# Patient Record
Sex: Female | Born: 1935 | Race: White | Hispanic: No | Marital: Single
Health system: Southern US, Community
[De-identification: ages and names within clinical notes are randomized; demographics above are authoritative.]

## PROBLEM LIST (undated history)

## (undated) DIAGNOSIS — F32A Depression, unspecified: Secondary | ICD-10-CM

## (undated) DIAGNOSIS — M545 Low back pain, unspecified: Secondary | ICD-10-CM

## (undated) DIAGNOSIS — F419 Anxiety disorder, unspecified: Secondary | ICD-10-CM

## (undated) DIAGNOSIS — F0390 Unspecified dementia without behavioral disturbance: Secondary | ICD-10-CM

## (undated) DIAGNOSIS — R55 Syncope and collapse: Secondary | ICD-10-CM

## (undated) DIAGNOSIS — F329 Major depressive disorder, single episode, unspecified: Secondary | ICD-10-CM

## (undated) DIAGNOSIS — G4733 Obstructive sleep apnea (adult) (pediatric): Secondary | ICD-10-CM

## (undated) DIAGNOSIS — M810 Age-related osteoporosis without current pathological fracture: Secondary | ICD-10-CM

## (undated) DIAGNOSIS — G8929 Other chronic pain: Secondary | ICD-10-CM

## (undated) DIAGNOSIS — K219 Gastro-esophageal reflux disease without esophagitis: Secondary | ICD-10-CM

## (undated) HISTORY — PX: ABDOMINAL HYSTERECTOMY: SHX81

## (undated) HISTORY — PX: OTHER SURGICAL HISTORY: SHX169

---

## 1999-03-02 ENCOUNTER — Ambulatory Visit (HOSPITAL_COMMUNITY): Admission: RE | Admit: 1999-03-02 | Discharge: 1999-03-02 | Payer: Self-pay | Admitting: *Deleted

## 1999-10-31 ENCOUNTER — Encounter: Admission: RE | Admit: 1999-10-31 | Discharge: 1999-10-31 | Payer: Self-pay | Admitting: Internal Medicine

## 1999-11-08 ENCOUNTER — Encounter: Payer: Self-pay | Admitting: Internal Medicine

## 1999-11-08 ENCOUNTER — Encounter: Admission: RE | Admit: 1999-11-08 | Discharge: 1999-11-08 | Payer: Self-pay | Admitting: Internal Medicine

## 2000-02-26 ENCOUNTER — Encounter: Payer: Self-pay | Admitting: Neurosurgery

## 2000-02-26 ENCOUNTER — Ambulatory Visit (HOSPITAL_COMMUNITY): Admission: RE | Admit: 2000-02-26 | Discharge: 2000-02-26 | Payer: Self-pay | Admitting: Neurosurgery

## 2000-03-12 ENCOUNTER — Ambulatory Visit (HOSPITAL_COMMUNITY): Admission: RE | Admit: 2000-03-12 | Discharge: 2000-03-12 | Payer: Self-pay | Admitting: Neurosurgery

## 2000-03-12 ENCOUNTER — Encounter: Payer: Self-pay | Admitting: Neurosurgery

## 2000-10-21 ENCOUNTER — Encounter: Admission: RE | Admit: 2000-10-21 | Discharge: 2000-10-21 | Payer: Self-pay | Admitting: Internal Medicine

## 2000-10-21 ENCOUNTER — Encounter: Payer: Self-pay | Admitting: Internal Medicine

## 2001-02-04 ENCOUNTER — Encounter: Payer: Self-pay | Admitting: Internal Medicine

## 2001-02-04 ENCOUNTER — Encounter: Admission: RE | Admit: 2001-02-04 | Discharge: 2001-02-04 | Payer: Self-pay | Admitting: Internal Medicine

## 2001-03-05 ENCOUNTER — Encounter: Payer: Self-pay | Admitting: Internal Medicine

## 2001-03-05 ENCOUNTER — Ambulatory Visit (HOSPITAL_COMMUNITY): Admission: RE | Admit: 2001-03-05 | Discharge: 2001-03-05 | Payer: Self-pay | Admitting: Internal Medicine

## 2001-04-15 ENCOUNTER — Ambulatory Visit: Admission: RE | Admit: 2001-04-15 | Discharge: 2001-04-15 | Payer: Self-pay | Admitting: Pulmonary Disease

## 2002-11-17 ENCOUNTER — Ambulatory Visit (HOSPITAL_COMMUNITY): Admission: RE | Admit: 2002-11-17 | Discharge: 2002-11-17 | Payer: Self-pay | Admitting: *Deleted

## 2002-11-17 ENCOUNTER — Encounter: Payer: Self-pay | Admitting: *Deleted

## 2002-12-04 ENCOUNTER — Ambulatory Visit (HOSPITAL_COMMUNITY): Admission: RE | Admit: 2002-12-04 | Discharge: 2002-12-04 | Payer: Self-pay | Admitting: *Deleted

## 2003-01-06 ENCOUNTER — Emergency Department (HOSPITAL_COMMUNITY): Admission: EM | Admit: 2003-01-06 | Discharge: 2003-01-06 | Payer: Self-pay | Admitting: Emergency Medicine

## 2003-01-06 ENCOUNTER — Encounter: Payer: Self-pay | Admitting: Emergency Medicine

## 2003-11-18 ENCOUNTER — Encounter: Admission: RE | Admit: 2003-11-18 | Discharge: 2003-11-18 | Payer: Self-pay | Admitting: Internal Medicine

## 2004-09-12 ENCOUNTER — Ambulatory Visit (HOSPITAL_COMMUNITY): Admission: RE | Admit: 2004-09-12 | Discharge: 2004-09-12 | Payer: Self-pay | Admitting: Internal Medicine

## 2004-11-07 ENCOUNTER — Ambulatory Visit: Admission: RE | Admit: 2004-11-07 | Discharge: 2004-11-07 | Payer: Self-pay | Admitting: Internal Medicine

## 2004-12-13 ENCOUNTER — Ambulatory Visit: Payer: Self-pay | Admitting: Pulmonary Disease

## 2004-12-22 ENCOUNTER — Encounter: Admission: RE | Admit: 2004-12-22 | Discharge: 2004-12-22 | Payer: Self-pay | Admitting: Internal Medicine

## 2005-01-02 ENCOUNTER — Ambulatory Visit: Payer: Self-pay | Admitting: Pulmonary Disease

## 2005-03-21 ENCOUNTER — Ambulatory Visit: Payer: Self-pay | Admitting: Pulmonary Disease

## 2008-02-19 ENCOUNTER — Encounter: Admission: RE | Admit: 2008-02-19 | Discharge: 2008-02-19 | Payer: Self-pay | Admitting: Internal Medicine

## 2008-06-18 ENCOUNTER — Encounter: Admission: RE | Admit: 2008-06-18 | Discharge: 2008-06-18 | Payer: Self-pay | Admitting: Internal Medicine

## 2008-07-13 ENCOUNTER — Encounter (HOSPITAL_COMMUNITY): Admission: RE | Admit: 2008-07-13 | Discharge: 2008-09-01 | Payer: Self-pay | Admitting: Internal Medicine

## 2008-12-13 ENCOUNTER — Encounter: Admission: RE | Admit: 2008-12-13 | Discharge: 2008-12-13 | Payer: Self-pay | Admitting: Neurosurgery

## 2008-12-15 ENCOUNTER — Encounter: Admission: RE | Admit: 2008-12-15 | Discharge: 2008-12-15 | Payer: Self-pay | Admitting: Neurosurgery

## 2010-10-05 ENCOUNTER — Encounter: Admission: RE | Admit: 2010-10-05 | Discharge: 2010-10-05 | Payer: Self-pay | Admitting: Internal Medicine

## 2011-01-21 ENCOUNTER — Encounter: Payer: Self-pay | Admitting: Neurosurgery

## 2011-05-18 HISTORY — PX: UPPER GI ENDOSCOPY: SHX6162

## 2011-05-18 NOTE — Op Note (Signed)
   NAME:  Morgan Macdonald, Morgan Macdonald                         ACCOUNT NO.:  [id number]   MEDICAL RECORD NO.:  [id number]                   PATIENT TYPE:  AMB   LOCATION:  ENDO                                 FACILITY:  [facility]   PHYSICIAN:  Georgiana Spinner, M.D.                 DATE OF BIRTH:  [birth year]   DATE OF PROCEDURE:  DATE OF DISCHARGE:                                 OPERATIVE REPORT   PROCEDURE:  Upper endoscopy.   INDICATIONS FOR PROCEDURE:  Gastroesophageal reflux disease, abdominal pain.   ANESTHESIA:  Demerol 40, Versed 4 mg.   DESCRIPTION OF PROCEDURE:  With the patient mildly sedated in the left  lateral decubitus position, the Olympus videoscopic endoscope was inserted  in the mouth and passed under direct vision through the esophagus which  showed a possible distal esophageal stricture. This was photographed, it was  mildly inflamed. We entered into the stomach. The fundus, body, antrum,  duodenal bulb and second portion of the duodenum were visualized. From this  point, the endoscope was slowly withdrawn taking circumferential views of  the entire duodenal mucosa and the ampulla was seen and photographed.  The  endoscope was pulled back into the stomach, placed in retroflexion to view  the stomach from below. The endoscope was straightened and withdrawn taking  circumferential views of the remaining gastric and esophageal mucosa. The  patient's vital signs and pulse oximeter remained stable. The patient  tolerated the procedure well without apparent complications.   FINDINGS:  Small amount of blood flecks in the stomach with some  inflammatory changes of the distal esophagus possibly early stricture  formation.   PLAN:  Will start the patient on proton pump inhibitor therapy if not  already on and have patient followup with me as an outpatient.                                                Georgiana Spinner, M.D.    GMO/MEDQ  D:  12/04/2002  T:  12/04/2002  Job:  [id number]

## 2012-05-16 ENCOUNTER — Other Ambulatory Visit: Payer: Self-pay | Admitting: Neurology

## 2012-05-16 DIAGNOSIS — F0280 Dementia in other diseases classified elsewhere without behavioral disturbance: Secondary | ICD-10-CM

## 2012-05-20 ENCOUNTER — Ambulatory Visit
Admission: RE | Admit: 2012-05-20 | Discharge: 2012-05-20 | Disposition: A | Discharge status: Home / Self Care | Payer: Medicare Other | Source: Ambulatory Visit | Attending: Neurology | Admitting: Neurology

## 2012-05-20 DIAGNOSIS — G309 Alzheimer's disease, unspecified: Secondary | ICD-10-CM

## 2014-06-21 ENCOUNTER — Observation Stay (HOSPITAL_COMMUNITY)
Admission: EM | Admit: 2014-06-21 | Discharge: 2014-06-24 | Disposition: A | Discharge status: Nursing Home (Includes ICF) | Payer: Medicare PPO | Attending: Internal Medicine | Admitting: Internal Medicine

## 2014-06-21 ENCOUNTER — Encounter (HOSPITAL_COMMUNITY): Payer: Self-pay | Admitting: Emergency Medicine

## 2014-06-21 ENCOUNTER — Observation Stay (HOSPITAL_COMMUNITY): Payer: Medicare PPO

## 2014-06-21 DIAGNOSIS — Z7982 Long term (current) use of aspirin: Secondary | ICD-10-CM | POA: Insufficient documentation

## 2014-06-21 DIAGNOSIS — Z66 Do not resuscitate: Secondary | ICD-10-CM | POA: Insufficient documentation

## 2014-06-21 DIAGNOSIS — K5900 Constipation, unspecified: Secondary | ICD-10-CM | POA: Insufficient documentation

## 2014-06-21 DIAGNOSIS — Z79899 Other long term (current) drug therapy: Secondary | ICD-10-CM | POA: Insufficient documentation

## 2014-06-21 DIAGNOSIS — I059 Rheumatic mitral valve disease, unspecified: Secondary | ICD-10-CM

## 2014-06-21 DIAGNOSIS — G2581 Restless legs syndrome: Secondary | ICD-10-CM | POA: Insufficient documentation

## 2014-06-21 DIAGNOSIS — F3289 Other specified depressive episodes: Secondary | ICD-10-CM | POA: Insufficient documentation

## 2014-06-21 DIAGNOSIS — F411 Generalized anxiety disorder: Secondary | ICD-10-CM | POA: Insufficient documentation

## 2014-06-21 DIAGNOSIS — K219 Gastro-esophageal reflux disease without esophagitis: Secondary | ICD-10-CM | POA: Insufficient documentation

## 2014-06-21 DIAGNOSIS — F0390 Unspecified dementia without behavioral disturbance: Secondary | ICD-10-CM | POA: Insufficient documentation

## 2014-06-21 DIAGNOSIS — M810 Age-related osteoporosis without current pathological fracture: Secondary | ICD-10-CM | POA: Insufficient documentation

## 2014-06-21 DIAGNOSIS — T671XXD Heat syncope, subsequent encounter: Secondary | ICD-10-CM

## 2014-06-21 DIAGNOSIS — R55 Syncope and collapse: Principal | ICD-10-CM | POA: Insufficient documentation

## 2014-06-21 DIAGNOSIS — F329 Major depressive disorder, single episode, unspecified: Secondary | ICD-10-CM | POA: Insufficient documentation

## 2014-06-21 DIAGNOSIS — E43 Unspecified severe protein-calorie malnutrition: Secondary | ICD-10-CM | POA: Insufficient documentation

## 2014-06-21 HISTORY — DX: Other chronic pain: G89.29

## 2014-06-21 HISTORY — DX: Anxiety disorder, unspecified: F41.9

## 2014-06-21 HISTORY — DX: Unspecified dementia, unspecified severity, without behavioral disturbance, psychotic disturbance, mood disturbance, and anxiety: F03.90

## 2014-06-21 HISTORY — DX: Gastro-esophageal reflux disease without esophagitis: K21.9

## 2014-06-21 HISTORY — DX: Obstructive sleep apnea (adult) (pediatric): G47.33

## 2014-06-21 HISTORY — DX: Age-related osteoporosis without current pathological fracture: M81.0

## 2014-06-21 HISTORY — DX: Low back pain, unspecified: M54.50

## 2014-06-21 HISTORY — DX: Major depressive disorder, single episode, unspecified: F32.9

## 2014-06-21 HISTORY — DX: Depression, unspecified: F32.A

## 2014-06-21 HISTORY — DX: Low back pain: M54.5

## 2014-06-21 LAB — I-STAT TROPONIN, ED: TROPONIN I, POC: 0 ng/mL (ref 0.00–0.08)

## 2014-06-21 LAB — COMPREHENSIVE METABOLIC PANEL
ALT: 13 U/L (ref 0–35)
AST: 29 U/L (ref 0–37)
Albumin: 3.2 g/dL — ABNORMAL LOW (ref 3.5–5.2)
Alkaline Phosphatase: 57 U/L (ref 39–117)
BILIRUBIN TOTAL: 0.3 mg/dL (ref 0.3–1.2)
BUN: 17 mg/dL (ref 6–23)
CALCIUM: 9 mg/dL (ref 8.4–10.5)
CO2: 25 meq/L (ref 19–32)
CREATININE: 0.72 mg/dL (ref 0.50–1.10)
Chloride: 106 mEq/L (ref 96–112)
GFR, EST AFRICAN AMERICAN: 88 mL/min — AB (ref >90)
GFR, EST NON AFRICAN AMERICAN: 76 mL/min — AB (ref >90)
GLUCOSE: 81 mg/dL (ref 70–99)
Potassium: 4.3 mEq/L (ref 3.7–5.3)
Sodium: 144 mEq/L (ref 137–147)
Total Protein: 6.3 g/dL (ref 6.0–8.3)

## 2014-06-21 LAB — CBC WITH DIFFERENTIAL/PLATELET
BASOS ABS: 0 10*3/uL (ref 0.0–0.1)
Basophils Relative: 0 % (ref 0–1)
EOS PCT: 0 % (ref 0–5)
Eosinophils Absolute: 0 10*3/uL (ref 0.0–0.7)
HEMATOCRIT: 44.3 % (ref 36.0–46.0)
HEMOGLOBIN: 14.4 g/dL (ref 12.0–15.0)
LYMPHS PCT: 17 % (ref 12–46)
Lymphs Abs: 1.2 10*3/uL (ref 0.7–4.0)
MCH: 31.9 pg (ref 26.0–34.0)
MCHC: 32.5 g/dL (ref 30.0–36.0)
MCV: 98.2 fL (ref 78.0–100.0)
MONO ABS: 0.5 10*3/uL (ref 0.1–1.0)
MONOS PCT: 7 % (ref 3–12)
Neutro Abs: 5.3 10*3/uL (ref 1.7–7.7)
Neutrophils Relative %: 76 % (ref 43–77)
Platelets: 185 10*3/uL (ref 150–400)
RBC: 4.51 MIL/uL (ref 3.87–5.11)
RDW: 13.1 % (ref 11.5–15.5)
WBC: 7.1 10*3/uL (ref 4.0–10.5)

## 2014-06-21 LAB — URINALYSIS, ROUTINE W REFLEX MICROSCOPIC
Bilirubin Urine: NEGATIVE
Glucose, UA: NEGATIVE mg/dL
Hgb urine dipstick: NEGATIVE
Ketones, ur: NEGATIVE mg/dL
Leukocytes, UA: NEGATIVE
NITRITE: NEGATIVE
PROTEIN: NEGATIVE mg/dL
SPECIFIC GRAVITY, URINE: 1.021 (ref 1.005–1.030)
UROBILINOGEN UA: 1 mg/dL (ref 0.0–1.0)
pH: 7.5 (ref 5.0–8.0)

## 2014-06-21 LAB — TROPONIN I
Troponin I: <0.3 ng/mL (ref <0.30)
Troponin I: <0.3 ng/mL (ref <0.30)

## 2014-06-21 LAB — CBG MONITORING, ED: Glucose-Capillary: 74 mg/dL (ref 70–99)

## 2014-06-21 LAB — MRSA PCR SCREENING: MRSA by PCR: POSITIVE — AB

## 2014-06-21 LAB — TSH: TSH: 3.24 u[IU]/mL (ref 0.350–4.500)

## 2014-06-21 MED ORDER — MEMANTINE HCL ER 28 MG PO CP24
28.0000 mg | ORAL_CAPSULE | Freq: Every day | ORAL | Status: DC
  Administered 2014-06-21 – 2014-06-24 (×4): 28 mg via ORAL
  Filled 2014-06-21 (×4): qty 28

## 2014-06-21 MED ORDER — ONDANSETRON HCL 4 MG PO TABS: 4.0000 mg | ORAL_TABLET | Freq: Four times a day (QID) | ORAL | Status: DC | PRN

## 2014-06-21 MED ORDER — ASPIRIN 81 MG PO CHEW
81.0000 mg | CHEWABLE_TABLET | Freq: Every day | ORAL | Status: DC
  Administered 2014-06-21 – 2014-06-24 (×4): 81 mg via ORAL
  Filled 2014-06-21 (×4): qty 1

## 2014-06-21 MED ORDER — CHLORHEXIDINE GLUCONATE CLOTH 2 % EX PADS
6.0000 | MEDICATED_PAD | Freq: Every day | CUTANEOUS | Status: DC
  Administered 2014-06-22 – 2014-06-24 (×3): 6 via TOPICAL

## 2014-06-21 MED ORDER — ACETAMINOPHEN 650 MG RE SUPP: 650.0000 mg | Freq: Four times a day (QID) | RECTAL | Status: DC | PRN

## 2014-06-21 MED ORDER — ONDANSETRON HCL 4 MG/2ML IJ SOLN: 4.0000 mg | Freq: Four times a day (QID) | INTRAMUSCULAR | Status: DC | PRN

## 2014-06-21 MED ORDER — DIVALPROEX SODIUM ER 500 MG PO TB24
750.0000 mg | ORAL_TABLET | Freq: Every day | ORAL | Status: DC
  Administered 2014-06-21 – 2014-06-24 (×4): 750 mg via ORAL
  Filled 2014-06-21 (×6): qty 1

## 2014-06-21 MED ORDER — ACETAMINOPHEN 325 MG PO TABS: 650.0000 mg | ORAL_TABLET | Freq: Four times a day (QID) | ORAL | Status: DC | PRN

## 2014-06-21 MED ORDER — MUPIROCIN 2 % EX OINT
1.0000 "application " | TOPICAL_OINTMENT | Freq: Two times a day (BID) | CUTANEOUS | Status: DC
  Administered 2014-06-22 – 2014-06-24 (×5): 1 via NASAL
  Filled 2014-06-21: qty 22

## 2014-06-21 MED ORDER — BIOTENE DRY MOUTH MT LIQD
15.0000 mL | Freq: Two times a day (BID) | OROMUCOSAL | Status: DC
  Administered 2014-06-21 – 2014-06-24 (×6): 15 mL via OROMUCOSAL

## 2014-06-21 MED ORDER — LORAZEPAM 0.5 MG PO TABS
0.2500 mg | ORAL_TABLET | Freq: Every day | ORAL | Status: DC | PRN
  Administered 2014-06-23: 0.25 mg via ORAL
  Filled 2014-06-21 (×2): qty 1

## 2014-06-21 MED ORDER — SERTRALINE HCL 50 MG PO TABS
50.0000 mg | ORAL_TABLET | Freq: Every day | ORAL | Status: DC
  Administered 2014-06-21 – 2014-06-24 (×4): 50 mg via ORAL
  Filled 2014-06-21 (×4): qty 1

## 2014-06-21 MED ORDER — DONEPEZIL HCL 10 MG PO TABS
10.0000 mg | ORAL_TABLET | Freq: Every day | ORAL | Status: DC
  Administered 2014-06-21 – 2014-06-23 (×3): 10 mg via ORAL
  Filled 2014-06-21 (×4): qty 1

## 2014-06-21 MED ORDER — ENOXAPARIN SODIUM 40 MG/0.4ML ~~LOC~~ SOLN
40.0000 mg | SUBCUTANEOUS | Status: DC
  Administered 2014-06-21 – 2014-06-23 (×3): 40 mg via SUBCUTANEOUS
  Filled 2014-06-21 (×4): qty 0.4

## 2014-06-21 MED ORDER — SODIUM CHLORIDE 0.9 % IV SOLN
INTRAVENOUS | Status: DC
  Administered 2014-06-21: 75 mL/h via INTRAVENOUS
  Administered 2014-06-22 – 2014-06-23 (×3): via INTRAVENOUS

## 2014-06-21 NOTE — H&P (Signed)
PCP:   No primary provider on file.   Chief Complaint:  Passed out  HPI: 80 year old female with a history of dementia, osteoporosis, depression/anxiety, GERD, obstructive sleep apnea, chronic low back pain, restless leg syndrome who was brought to the ED [facility], after patient had an episode of syncope. Patient has dementia and is a poor historian as per the ED notes patient got up from sitting position and after that she felt dizzy and was about to fall. The nursing home staff assisted her to the floor and she did not fall. She denies hitting her head, denies pain anywhere in the body .As per the  Report patient did pass out for about 10 seconds. There was no witnessed seizure like activity. In the ED patient is asymptomatic, denies any complaints denies chest pain no shortness of breath no nausea vomiting or diarrhea. She is alert but not oriented x3. Cardiac enzymes are negative in the ED.  Allergies:  No Known Allergies  Past medical history Dementia GERD Depression Obstructive sleep apnea Chronic low back pain Restless leg syndrome    Prior to Admission medications   Medication Sig Start Date End Date Taking? Authorizing Provider  aspirin 81 MG chewable tablet Chew 81 mg by mouth every morning.   Yes Historical Provider, MD  divalproex (DEPAKOTE ER) 250 MG 24 hr tablet Take 750 mg by mouth every morning.   Yes Historical Provider, MD  donepezil (ARICEPT) 10 MG tablet Take 10 mg by mouth at bedtime.   Yes Historical Provider, MD  LORazepam (ATIVAN) 0.5 MG tablet Take 0.25 mg by mouth daily as needed for anxiety.   Yes Historical Provider, MD  Memantine HCl ER (NAMENDA XR) 28 MG CP24 Take 28 mg by mouth every morning.   Yes Historical Provider, MD  Multiple Vitamins-Iron (MULTIVITAMINS WITH IRON) TABS tablet Take 1 tablet by mouth every morning.   Yes Historical Provider, MD  sertraline (ZOLOFT) 50 MG tablet Take 50 mg by mouth  every morning.   Yes Historical Provider, MD  vitamin E 400 UNIT capsule Take 400 Units by mouth every morning.   Yes Historical Provider, MD    Social History:  reports that she does not drink alcohol. Her tobacco and drug histories are not on file.    All the positives are listed in BOLD  Review of Systems:  HEENT: Headache, blurred vision, runny nose, sore throat Neck: Hypothyroidism, hyperthyroidism,,lymphadenopathy Chest : Shortness of breath, history of COPD, Asthma Heart : Chest pain, history of coronary arterey disease GI:  Nausea, vomiting, diarrhea, constipation, GERD GU: Dysuria, urgency, frequency of urination, hematuria Psych: Depression, anxiety, hallucinations   Physical Exam: Blood pressure 136/89, pulse 79, temperature 97.4 F (36.3 C), temperature source Oral, resp. rate 15, SpO2 100.00%. Constitutional:   Patient is a well-developed and well-nourished female, pleasantly confused* in no acute distress and cooperative with exam. Head: Normocephalic and atraumatic Mouth: Mucus membranes moist Eyes: PERRL, EOMI, conjunctivae normal Neck: Supple, No Thyromegaly Cardiovascular: RRR, S1 normal, S2 normal Pulmonary/Chest: CTAB, no wheezes, rales, or rhonchi Abdominal: Soft. Non-tender, non-distended, bowel sounds are normal, no masses, organomegaly, or guarding present.  Neurological: Alert but not oriented x3, pleasantly confused, follow instructions,  Strenght is normal and symmetric bilaterally, cranial nerve II-XII are grossly intact, no focal motor deficit, sensory intact to light touch bilaterally.  Extremities : No Cyanosis, Clubbing or Edema  Labs on Admission:  Basic Metabolic Panel:  Recent Labs Lab 06/21/14  1129  NA 144  K 4.3  CL 106  CO2 25  GLUCOSE 81  BUN 17  CREATININE 0.72  CALCIUM 9.0   Liver Function Tests:  Recent Labs Lab 06/21/14 1129  AST 29  ALT 13  ALKPHOS 57  BILITOT 0.3  PROT 6.3  ALBUMIN 3.2*   No results found for  this basename: LIPASE, AMYLASE,  in the last 168 hours No results found for this basename: AMMONIA,  in the last 168 hours CBC:  Recent Labs Lab 06/21/14 1129  WBC 7.1  NEUTROABS 5.3  HGB 14.4  HCT 44.3  MCV 98.2  PLT 185   CBG:  Recent Labs Lab 06/21/14 0926  GLUCAP 74    Radiological Exams on Admission: No results found.  EKG: Independently reviewed. Normal sinus rhythm with borderline right axis deviation   Assessment/Plan Active Problems:   Syncope   Dementia  Syncope Will admit the patient under observation, telemetry. Obtain 2-D echocardiogram and serial cardiac enzymes. Also check orthostatics every shift  Based on the above results patient may need outpatient Holter monitoring. And also check CT head to rule out any underlying neurologic abnormality  Dementia Continue Namenda, Aricept, divalproex  Anxiety Continue Ativan when necessary  DVT prophylaxis Lovenox  Code status: Patient is full code  Family discussion: No family at bedside,    Time Spent on Admission: 55 minutes  LAMA,GAGAN S [facility] Pager: [phone] 06/21/2014, 1:43 PM  If 7PM-7AM, please contact night-coverage  [email or web address]  Password [email or web address]

## 2014-06-21 NOTE — Progress Notes (Signed)
CRITICAL VALUE ALERT  Critical value received:  MRSA Positive   Date of notification:  06/21/14  Time of notification:  2215 Critical value read back:yes  Nurse who received alert:  Linward NatalLindsay Hudson RN  MD notified (1st page):  NA Time of first page:  NA MD notified (2nd page):  Time of second page:  Responding MD:  NA Time MD responded:  NA RN put in orders for MRSA positive

## 2014-06-21 NOTE — Progress Notes (Signed)
  Echocardiogram 2D Echocardiogram has been performed.  Jorje GuildCHUNG, KWONG 06/21/2014, 4:09 PM

## 2014-06-21 NOTE — ED Notes (Signed)
Bed: WA24 Expected date:  Expected time:  Means of arrival:  Comments: EMS Fall 

## 2014-06-21 NOTE — ED Notes (Signed)
Patient lives in a [facility]  Had a syncople episode this am around 0800, that was witnessed by nursing staff. They assisted her to floor - she did not fall. Patient has Alzheimer's disease and in not oriented to time, date, or place.

## 2014-06-21 NOTE — ED Provider Notes (Signed)
CSN: [id number]     Arrival date & time 06/21/14  [id number] History   First MD Initiated Contact with Patient 06/21/14 0945     Chief Complaint  Patient presents with  . Loss of Consciousness    Per EMS, patient told staff nearby that she was feeling tired, then passed out. She was helped to the floor with the present staff, and did not fall.  Nursing home staff state she was unconsious for about 10 seconds, then awoke confused about what had occured.     (Consider location/radiation/quality/duration/timing/severity/associated sxs/prior Treatment) HPI Comments: [facility] due to a syncopal episode.  I spoke with staff on the phone and they report that around 8 AM the patient was complaining of feeling weak while standing and started to pass out.  They then caught her and lowered her to the floor.  She did not fall.  They report that the patient was unconscious for approximately ten seconds.  When she regained consciousness the patient was unaware of what occurred.  Patient does have dementia at baseline.  Staff report that they did not notice any seizure activity.  Staff report that her Depakote dose was recently increased 250 mg tid recently.  No other recent changes to medication.  She is on 81 mg ASA, but no other anticoagulants.  Patient denies chest pain or any other pain at this time.  Denies SOB.  Staff report that the patient was at her baseline prior to his episode.  No vomiting, diarrhea, or fever.    The history is provided by the patient and the EMS personnel (Nursing home staff).    No past medical history on file. No past surgical history on file. No family history on file. History  Substance Use Topics  . Smoking status: Not on file  . Smokeless tobacco: Not on file  . Alcohol Use: No   OB History   Grav Para Term Preterm Abortions TAB SAB Ect Mult Living                 Review of Systems  Unable to perform ROS: Dementia       Allergies  Review of patient's allergies indicates no known allergies.  Home Medications   Prior to Admission medications   Medication Sig Start Date End Date Taking? Authorizing Provider  aspirin 81 MG chewable tablet Chew 81 mg by mouth every morning.   Yes Historical Provider, MD  divalproex (DEPAKOTE ER) 250 MG 24 hr tablet Take 750 mg by mouth every morning.   Yes Historical Provider, MD  donepezil (ARICEPT) 10 MG tablet Take 10 mg by mouth at bedtime.   Yes Historical Provider, MD  LORazepam (ATIVAN) 0.5 MG tablet Take 0.25 mg by mouth daily as needed for anxiety.   Yes Historical Provider, MD  Memantine HCl ER (NAMENDA XR) 28 MG CP24 Take 28 mg by mouth every morning.   Yes Historical Provider, MD  Multiple Vitamins-Iron (MULTIVITAMINS WITH IRON) TABS tablet Take 1 tablet by mouth every morning.   Yes Historical Provider, MD  sertraline (ZOLOFT) 50 MG tablet Take 50 mg by mouth every morning.   Yes Historical Provider, MD  vitamin E 400 UNIT capsule Take 400 Units by mouth every morning.   Yes Historical Provider, MD   BP 132/82  Pulse 60  Temp(Src) 97.4 F (36.3 C) (Oral)  Resp 15  SpO2 100% Physical Exam  Nursing note and vitals reviewed. Constitutional: She appears well-developed  and well-nourished.  HENT:  Head: Normocephalic and atraumatic.  Mouth/Throat: Oropharynx is clear and moist.  Eyes: EOM are normal. Pupils are equal, round, and reactive to light.  Neck: Normal range of motion. Neck supple.  Cardiovascular: Normal rate, regular rhythm and normal heart sounds.   Pulmonary/Chest: Effort normal and breath sounds normal.  Abdominal: Soft. Bowel sounds are normal. She exhibits no distension. There is no tenderness.  Musculoskeletal: Normal range of motion.  Full ROM of UE and LE without pain  Neurological: She is alert. She has normal strength. No cranial nerve deficit. Gait normal.  Skin: Skin is warm and dry.  Psychiatric: She has a normal mood and  affect.    ED Course  Procedures (including critical care time) Labs Review Labs Reviewed  CBC WITH DIFFERENTIAL  COMPREHENSIVE METABOLIC PANEL  URINALYSIS, ROUTINE W REFLEX MICROSCOPIC  CBG MONITORING, ED    Imaging Review No results found.   EKG Interpretation   Date/Time:  Monday June 21 2014 09:17:19 EDT Ventricular Rate:  59 PR Interval:  120 QRS Duration: 82 QT Interval:  436 QTC Calculation: 432 R Axis:   87 Text Interpretation:  Sinus rhythm Borderline right axis deviation No old  tracing to compare Confirmed by [facility]  MD, TREY ([id number]) on 06/21/2014  11:11:28 AM      MDM   Final diagnoses:  None  Patient with a history of Dementia presents today from [facility] due to a syncopal episode that occurred this morning.  Patient was lowered down to the ground by staff and did not fall.  Full ROM of all extremities, able to ambulate, no signs of head trauma, and no focal neurological deficits.  Therefore, do not feel that imaging is indicated at this time.  VSS.  Labs unremarkable.  UA negative.  No ischemic changes on EKG.  Patient admitted to [facility] for further work up of syncope.      Santiago GladHeather Kazaria Gaertner, PA-C 06/22/14 2304

## 2014-06-21 NOTE — Progress Notes (Signed)
  CARE MANAGEMENT ED NOTE 06/21/2014  Patient:  Morgan Macdonald,Morgan Macdonald   Account Number:  [id number]  Date Initiated:  06/21/2014  Documentation initiated by:  Edd ArbourGIBBS,Dawana Asper  Subjective/Objective Assessment:   80 yr old [place] choice ppo [facility] alzheimer's without pcp listed in EPIC     Subjective/Objective Assessment Detail:   [facility] sheet indicates pcp as henry tripp  Pt noted to be alert to person not place, time or situation Unable to tell Cm  where she was Noted attempting to get out of bed at the end Redirected and assisted back in bed Provided with apple juice after speaking with ED RN     Action/Plan:   Spoke with pt Assisted back to bed EPIC updated   Action/Plan Detail:   Anticipated DC Date:  06/22/2014     Status Recommendation to Physician:   Result of Recommendation:    Other ED Services  Consult Working Plan    DC Planning Services  Other  PCP issues  Outpatient Services - Pt will follow up    Choice offered to / List presented to:            Status of service:  Completed, signed off  ED Comments:   ED Comments Detail:

## 2014-06-21 NOTE — ED Notes (Signed)
Patient transported to CT 

## 2014-06-22 LAB — COMPREHENSIVE METABOLIC PANEL
ALT: 10 U/L (ref 0–35)
AST: 20 U/L (ref 0–37)
Albumin: 2.6 g/dL — ABNORMAL LOW (ref 3.5–5.2)
Alkaline Phosphatase: 46 U/L (ref 39–117)
BUN: 11 mg/dL (ref 6–23)
CALCIUM: 8.3 mg/dL — AB (ref 8.4–10.5)
CHLORIDE: 106 meq/L (ref 96–112)
CO2: 26 mEq/L (ref 19–32)
Creatinine, Ser: 0.71 mg/dL (ref 0.50–1.10)
GFR calc non Af Amer: 76 mL/min — ABNORMAL LOW (ref >90)
GFR, EST AFRICAN AMERICAN: 89 mL/min — AB (ref >90)
Glucose, Bld: 78 mg/dL (ref 70–99)
Potassium: 4 mEq/L (ref 3.7–5.3)
SODIUM: 141 meq/L (ref 137–147)
Total Bilirubin: 0.3 mg/dL (ref 0.3–1.2)
Total Protein: 5 g/dL — ABNORMAL LOW (ref 6.0–8.3)

## 2014-06-22 LAB — CBC
HCT: 38.4 % (ref 36.0–46.0)
HEMOGLOBIN: 12.6 g/dL (ref 12.0–15.0)
MCH: 32.1 pg (ref 26.0–34.0)
MCHC: 32.8 g/dL (ref 30.0–36.0)
MCV: 97.7 fL (ref 78.0–100.0)
Platelets: 158 10*3/uL (ref 150–400)
RBC: 3.93 MIL/uL (ref 3.87–5.11)
RDW: 13 % (ref 11.5–15.5)
WBC: 5.1 10*3/uL (ref 4.0–10.5)

## 2014-06-22 LAB — TROPONIN I

## 2014-06-22 NOTE — Progress Notes (Signed)
Clinical Social Work Department BRIEF PSYCHOSOCIAL ASSESSMENT 06/22/2014  Patient:  Morgan Macdonald,Morgan Macdonald     Account Number:  [id number]     Admit date:  06/21/2014  Clinical Social Worker:  Orpah GreekFOLEY,KELLY, LCSWA  Date/Time:  06/22/2014 10:47 AM  Referred by:  Physician  Date Referred:  06/22/2014 Referred for  Other - See comment   Other Referral:   Admitted from: [facility] [address] [facility]   Interview type:  Family Other interview type:   patient's husband, Ree KidaJack via phone    PSYCHOSOCIAL DATA Living Status:  FACILITY [facility] Level of care:  Assisted Living Primary support name:  Morgan MouldsJack Macdonald (husband) ph#: [phone] Primary support relationship to patient:  SPOUSE Degree of support available:   good    CURRENT CONCERNS Current Concerns  Post-Acute Placement   Other Concerns:    SOCIAL WORK ASSESSMENT / PLAN CSW received consult that patient was admitted from [facility] [address] [facility].   Assessment/plan status:  Information/Referral to [facility] Other assessment/ plan:   Information/referral to community resources:   CSW completed FL[facility] and [facility], confirmed with France RavensMercedes that she should be able to return when stable.    PATIENT'S/FAMILY'S RESPONSE TO PLAN OF CARE: Patient's husband states that he spoke with Dr. Elvera LennoxGherghe this morning and is in agreement with returning to ALF, anticipating possible discharge tomorrow.       Lincoln MaxinKelly Harrison, LCSW [facility] Clinical Social Worker cell #: [phone]

## 2014-06-22 NOTE — Progress Notes (Signed)
Utilization review completed.  

## 2014-06-22 NOTE — Progress Notes (Signed)
PROGRESS NOTE  Lauris Chromanorma B Moss RUE:[id number] DOB: 1926/02/25 DOA: 06/21/2014 PCP: Florentina JennyRIPP, HENRY, MD  HPI: 80 year old female with a history of dementia, osteoporosis, depression/anxiety, GERD, obstructive sleep apnea, chronic low back pain, restless leg syndrome who was [facility] the ED [facility], after patient had an episode of syncope. Patient has dementia and is a poor historian as per the ED notes patient got up from sitting position and after that she felt dizzy and was about to fall. The nursing home staff assisted her to the floor and she did not fall. She denies hitting her head, denies pain anywhere in the body .As per the Report patient did pass out for about 10 seconds. There was no witnessed seizure like activity. In the ED patient is asymptomatic, denies any complaints denies chest pain no shortness of breath no nausea vomiting or diarrhea. She is alert but not oriented x3. Cardiac enzymes are negative in the ED.  Assessment/Plan: Syncope - likely vasovagal vs dehydration, 2D echo pending, CE negative x 3 - obtain carotid dopplers - repeat orthostatics today, continue gentle hydration Dementia - Continue Namenda, Aricept, divalproex  Anxiety - Continue Ativan when necessary   Diet: heart Fluids: NS DVT Prophylaxis: Lovenox  Code Status: DNR Family Communication: d/w husband  Disposition Plan: SNF in am pending imaging studies  Consultants:  None   Procedures:  2D echo pending  Carotid doppler pending   Antibiotics - none   HPI/Subjective: No complaints  Objective: Filed Vitals:   06/21/14 2055 06/21/14 2059 06/21/14 2104 06/22/14 0412  BP: 126/65 144/44 136/107 132/63  Pulse: 63 73 76 56  Temp: 98.2 F (36.8 C)   97.4 F (36.3 C)  TempSrc: Oral   Axillary  Resp: 20   16  Height: 5\' 2"  (1.575 m)     Weight:      SpO2: 98% 99% 100% 100%    Intake/Output Summary (Last 24 hours) at 06/22/14 1208 Last data  filed at 06/22/14 [id number]  Gross per 24 hour  Intake 1253.75 ml  Output   1125 ml  Net 128.75 ml   Filed Weights   06/21/14 1632  Weight: 44.498 kg (98 lb 1.6 oz)    Exam:  General:  NAD, dementia  Cardiovascular: regular rate and rhythm, without MRG  Respiratory: good air movement, clear to auscultation throughout, no wheezing, ronchi or rales  Abdomen: soft, not tender to palpation, positive bowel sounds  MSK: no peripheral edema  Neuro: non focal  Data Reviewed: Basic Metabolic Panel:  Recent Labs Lab 06/21/14 1129 06/22/14 0335  NA 144 141  K 4.3 4.0  CL 106 106  CO2 25 26  GLUCOSE 81 78  BUN 17 11  CREATININE 0.72 0.71  CALCIUM 9.0 8.3*   Liver Function Tests:  Recent Labs Lab 06/21/14 1129 06/22/14 0335  AST 29 20  ALT 13 10  ALKPHOS 57 46  BILITOT 0.3 0.3  PROT 6.3 5.0*  ALBUMIN 3.2* 2.6*   CBC:  Recent Labs Lab 06/21/14 1129 06/22/14 0335  WBC 7.1 5.1  NEUTROABS 5.3  --   HGB 14.4 12.6  HCT 44.3 38.4  MCV 98.2 97.7  PLT 185 158   Cardiac Enzymes:  Recent Labs Lab 06/21/14 1534 06/21/14 2048 06/22/14 0335  TROPONINI <0.30 <0.30 <0.30   CBG:  Recent Labs Lab 06/21/14 0926  GLUCAP 74    Recent Results (from the past 240 hour(s))  MRSA PCR SCREENING  Status: Abnormal   Collection Time    06/21/14  6:44 PM      Result Value Ref Range Status   MRSA by PCR POSITIVE (*) NEGATIVE Final   Comment:            The GeneXpert MRSA Assay (FDA     approved for NASAL specimens     only), is one component of a     comprehensive MRSA colonization     surveillance program. It is not     intended to diagnose MRSA     infection nor to guide or     monitor treatment for     MRSA infections.     RESULT CALLED TO, READ BACK BY AND VERIFIED WITH:     LHUDSON RN AT 2215 ON [id number] BY DLONG     Studies: Ct Head Wo Contrast  06/21/2014   CLINICAL DATA:  Syncope with dizziness.  Dementia.  EXAM: CT HEAD WITHOUT CONTRAST  TECHNIQUE:  Contiguous axial images were obtained from the base of the skull through the vertex without intravenous contrast.  COMPARISON:  05/20/2012.  FINDINGS: There is no evidence for acute hemorrhage, hydrocephalus, mass lesion, or abnormal extra-axial fluid collection. No definite CT evidence for acute infarction. Diffuse loss of parenchymal volume is consistent with atrophy. Patchy low attenuation in the deep hemispheric and periventricular white matter is nonspecific, but likely reflects chronic microvascular ischemic demyelination. Chronic lacunar infarcts are seen in the right basal ganglia.  The visualized paranasal sinuses and mastoid air cells are clear.  IMPRESSION: Stable.  No acute intracranial abnormality.  Atrophy with chronic small vessel white matter ischemic demyelination.   Electronically Signed   By: Kennith CenterEric  Mansell M.D.   On: 06/21/2014 14:04    Scheduled Meds: . antiseptic oral rinse  15 mL Mouth Rinse BID  . aspirin  81 mg Oral Daily  . Chlorhexidine Gluconate Cloth  6 each Topical Q0600  . divalproex  750 mg Oral Daily  . donepezil  10 mg Oral QHS  . enoxaparin (LOVENOX) injection  40 mg Subcutaneous Q24H  . Memantine HCl ER  28 mg Oral Daily  . mupirocin ointment  1 application Nasal BID  . sertraline  50 mg Oral Daily   Continuous Infusions: . sodium chloride 75 mL/hr at 06/22/14 [id number]    Active Problems:   Syncope   Dementia   Time spent: 25  This note has been created with Education officer, environmentalDragon speech recognition software and smart phrase technology. Any transcriptional errors are unintentional.   Pamella Pertostin Gherghe, MD [facility] Pager [phone]. If 7 PM - 7 AM, please contact night-coverage at [email or web address], password [facility] [email or web address] 06/22/2014, 12:08 PM  LOS: 1 day

## 2014-06-23 DIAGNOSIS — R55 Syncope and collapse: Secondary | ICD-10-CM

## 2014-06-23 NOTE — Progress Notes (Signed)
Patient ID: Morgan Macdonald, female   DOB: [birth year], 80 y.o.   MRN: [id number]  [facility] PROGRESS NOTE  Morgan Chromanorma B Onder [id number]:[id number] DOB: [birth year] DOA: 06/21/2014 PCP: Florentina JennyRIPP, HENRY, MD  Brief narrative: 80 year old female with a history of dementia, osteoporosis, depression/anxiety, GERD, obstructive sleep apnea, chronic low back pain, restless leg syndrome who was brought to the ED from the [facility] care [facility], after patient had an episode of syncope. Patient has dementia and is a poor historian as per the ED notes patient got up from sitting position and after that she felt dizzy and was about to fall. The nursing home staff assisted her to the floor and she did not fall. She denies hitting her head, denies pain anywhere in the body .As per the Report patient did pass out for about 10 seconds. There was no witnessed seizure like activity. In the ED patient was asymptomatic.  Assessment/Plan:  Syncope  - likely vasovagal vs dehydration, 2D with normal EF, CE negative x 3  - obtained carotid dopplers, final report pending  - continued gentle hydration, stop IVF today and possible d/c in AM Dementia  - Continue Namenda, Aricept, divalproex  Anxiety  - Continue Ativan when necessary  Severe malnutrition - secondary to progressive dementia - tolerating current diet well   DVT Prophylaxis: Lovenox   Code Status: DNR  Family Communication: d/w husband  Disposition Plan: SNF in am  Consultants:  None  Procedures:  2D echo Carotid doppler pending Antibiotics  None   HPI/Subjective: No events overnight.   Objective: Filed Vitals:   06/22/14 2121 06/22/14 2122 06/22/14 2123 06/23/14 0553  BP: 141/70 134/73 120/69 121/88  Pulse: 65   76  Temp: 97.5 F (36.4 C)   97.7 F (36.5 C)  TempSrc: Oral   Oral  Resp: 16   18  Height:      Weight:      SpO2: 100%   97%    Intake/Output Summary (Last 24 hours) at 06/23/14 1517 Last data filed at  06/23/14 1205  Gross per 24 hour  Intake   2175 ml  Output    950 ml  Net   1225 ml    Exam:   General:  Pt is alert, follows commands appropriately, not in acute distress  Cardiovascular: Regular rate and rhythm, no rubs, no gallops  Respiratory: Clear to auscultation bilaterally, no wheezing, diminished breath sounds at bases   Abdomen: Soft, non tender, non distended, bowel sounds present, no guarding  Extremities: No edema, pulses DP and PT palpable bilaterally  Neuro: Grossly nonfocal  Data Reviewed: Basic Metabolic Panel:  Recent Labs Lab 06/21/14 1129 06/22/14 0335  NA 144 141  K 4.3 4.0  CL 106 106  CO2 25 26  GLUCOSE 81 78  BUN 17 11  CREATININE 0.72 0.71  CALCIUM 9.0 8.3*   Liver Function Tests:  Recent Labs Lab 06/21/14 1129 06/22/14 0335  AST 29 20  ALT 13 10  ALKPHOS 57 46  BILITOT 0.3 0.3  PROT 6.3 5.0*  ALBUMIN 3.2* 2.6*   CBC:  Recent Labs Lab 06/21/14 1129 06/22/14 0335  WBC 7.1 5.1  NEUTROABS 5.3  --   HGB 14.4 12.6  HCT 44.3 38.4  MCV 98.2 97.7  PLT 185 158   Cardiac Enzymes:  Recent Labs Lab 06/21/14 1534 06/21/14 2048 06/22/14 0335  TROPONINI <0.30 <0.30 <0.30   CBG:  Recent Labs Lab 06/21/14 0926  GLUCAP 74  Recent Results (from the past 240 hour(s))  MRSA PCR SCREENING     Status: Abnormal   Collection Time    06/21/14  6:44 PM      Result Value Ref Range Status   MRSA by PCR POSITIVE (*) NEGATIVE Final   Comment:            The GeneXpert MRSA Assay (FDA     approved for NASAL specimens     only), is one component of a     comprehensive MRSA colonization     surveillance program. It is not     intended to diagnose MRSA     infection nor to guide or     monitor treatment for     MRSA infections.     RESULT CALLED TO, READ BACK BY AND VERIFIED WITH:     LHUDSON RN AT 2215 ON [id number] BY DLONG     Scheduled Meds: . antiseptic oral rinse  15 mL Mouth Rinse BID  . aspirin  81 mg Oral Daily  .  Chlorhexidine Gluconate Cloth  6 each Topical Q0600  . divalproex  750 mg Oral Daily  . donepezil  10 mg Oral QHS  . enoxaparin (LOVENOX) injection  40 mg Subcutaneous Q24H  . Memantine HCl ER  28 mg Oral Daily  . mupirocin ointment  1 application Nasal BID  . sertraline  50 mg Oral Daily   Continuous Infusions:    Debbora PrestoMAGICK-MYERS, ISKRA, MD  [facility] Pager [phone]  If 7PM-7AM, please contact night-coverage [email or web address] Password [email or web address] 06/23/2014, 3:17 PM   LOS: 2 days

## 2014-06-23 NOTE — Progress Notes (Signed)
*  PRELIMINARY RESULTS* Vascular Ultrasound Carotid Duplex (Doppler) has been completed.  Preliminary findings: Bilateral:  1-39% ICA stenosis.  Vertebral artery flow is antegrade.     Farrel DemarkJill Eunice, RDMS, RVT  06/23/2014, 10:10 AM

## 2014-06-23 NOTE — Progress Notes (Signed)
Utilization review completed.  

## 2014-06-24 LAB — CBC
HCT: 41.8 % (ref 36.0–46.0)
Hemoglobin: 13.8 g/dL (ref 12.0–15.0)
MCH: 31.9 pg (ref 26.0–34.0)
MCHC: 33 g/dL (ref 30.0–36.0)
MCV: 96.8 fL (ref 78.0–100.0)
Platelets: 157 10*3/uL (ref 150–400)
RBC: 4.32 MIL/uL (ref 3.87–5.11)
RDW: 12.7 % (ref 11.5–15.5)
WBC: 6.1 10*3/uL (ref 4.0–10.5)

## 2014-06-24 LAB — BASIC METABOLIC PANEL
BUN: 9 mg/dL (ref 6–23)
CO2: 29 meq/L (ref 19–32)
Calcium: 8.9 mg/dL (ref 8.4–10.5)
Chloride: 102 mEq/L (ref 96–112)
Creatinine, Ser: 0.66 mg/dL (ref 0.50–1.10)
GFR calc non Af Amer: 78 mL/min — ABNORMAL LOW (ref >90)
Glucose, Bld: 84 mg/dL (ref 70–99)
Potassium: 3.7 mEq/L (ref 3.7–5.3)
Sodium: 141 mEq/L (ref 137–147)

## 2014-06-24 MED ORDER — FLEET ENEMA 7-19 GM/118ML RE ENEM
1.0000 | ENEMA | Freq: Once | RECTAL | Status: AC
  Administered 2014-06-24: 13:00:00 via RECTAL
  Filled 2014-06-24: qty 1

## 2014-06-24 MED ORDER — LORAZEPAM 0.5 MG PO TABS: 0.2500 mg | ORAL_TABLET | Freq: Every day | ORAL | Status: AC | PRN

## 2014-06-24 NOTE — Discharge Summary (Signed)
Physician Discharge Summary  Morgan Macdonald ZOX:[id number] DOB: 02/14/1926 DOA: 06/21/2014  PCP: Florentina JennyRIPP, HENRY, MD  Admit date: 06/21/2014 Discharge date: 06/24/2014  Recommendations for Outpatient Follow-up:  1. Pt will need to follow up with PCP in 2-3 weeks post discharge  Discharge Diagnoses:  Active Problems:   Syncope   Dementia  Discharge Condition: Stable  Diet recommendation: Heart healthy diet discussed in details   Brief narrative:  [age] year old female with a history of dementia, osteoporosis, depression/anxiety, GERD, obstructive sleep apnea, chronic low back pain, restless leg syndrome who was brought to the ED from the [facility], after patient had an episode of syncope. Patient has dementia and is a poor historian as per the ED notes patient got up from sitting position and after that she felt dizzy and was about to fall. The nursing home staff assisted her to the floor and she did not fall. She denies hitting her head, denies pain anywhere in the body .As per the Report patient did pass out for about 10 seconds. There was no witnessed seizure like activity. In the ED patient was asymptomatic.   Assessment/Plan:  Syncope  - likely vasovagal vs dehydration, 2D with normal EF, CE negative x 3  - obtained carotid dopplers, final report - The vertebral arteries appear patent with antegrade flow. Findings consistent with 1-39 percent stenosis involving the right internal carotid artery and the left internal carotid artery. ICA/CCA ratio. right = 1.11 left = 1.08. - stable, no syncopal events in the hospital  Dementia  - Continue Namenda, Aricept, divalproex  Anxiety  - Continue Ativan when necessary  Severe malnutrition  - secondary to progressive dementia  - tolerating current diet well  Constipation - provide enema prior to discharge   DVT Prophylaxis: Lovenox while inpatient  Code Status: DNR  Family Communication: pt at bedside    Consultants:  None  Procedures:  2D echo  Carotid doppler pending Antibiotics   None     Discharge Exam: Filed Vitals:   06/24/14 0532  BP: 132/89  Pulse: 63  Temp: 97.5 F (36.4 C)  Resp: 18   Filed Vitals:   06/23/14 0553 06/23/14 1651 06/23/14 2102 06/24/14 0532  BP: 121/88 164/76 138/95 132/89  Pulse: 76 60 60 63  Temp: 97.7 F (36.5 C) 97.7 F (36.5 C) 97.6 F (36.4 C) 97.5 F (36.4 C)  TempSrc: Oral Oral Oral Oral  Resp: 18 16 18 18   Height:      Weight:      SpO2: 97% 100% 99% 99%    General: Pt is alert, follows commands appropriately, not in acute distress Cardiovascular: Regular rate and rhythm, S1/S2 +, no murmurs, no rubs, no gallops Respiratory: Clear to auscultation bilaterally, no wheezing, no crackles, no rhonchi Abdominal: Soft, non tender, non distended, bowel sounds +, no guarding  Discharge Instructions  Discharge Instructions   Diet - low sodium heart healthy    Complete by:  As directed      Increase activity slowly    Complete by:  As directed             Medication List         aspirin 81 MG chewable tablet  Chew 81 mg by mouth every morning.     divalproex 250 MG 24 hr tablet  Commonly known as:  DEPAKOTE ER  Take 750 mg by mouth every morning.     donepezil 10 MG tablet  Commonly known as:  ARICEPT  Take 10 mg by mouth at bedtime.     LORazepam 0.5 MG tablet  Commonly known as:  ATIVAN  Take 0.5 tablets (0.25 mg total) by mouth daily as needed for anxiety.     multivitamins with iron Tabs tablet  Take 1 tablet by mouth every morning.     NAMENDA XR 28 MG Cp24  Generic drug:  Memantine HCl ER  Take 28 mg by mouth every morning.     sertraline 50 MG tablet  Commonly known as:  ZOLOFT  Take 50 mg by mouth every morning.     vitamin E 400 UNIT capsule  Take 400 Units by mouth every morning.           Follow-up Information   Schedule an appointment as soon as possible for a visit with Florentina JennyRIPP, HENRY, MD.    Specialty:  Family Medicine   Contact information:   [phone] [address] [place] [id number] [phone]        The results of significant diagnostics from this hospitalization (including imaging, microbiology, ancillary and laboratory) are listed below for reference.     Microbiology: Recent Results (from the past 240 hour(s))  MRSA PCR SCREENING     Status: Abnormal   Collection Time    06/21/14  6:44 PM      Result Value Ref Range Status   MRSA by PCR POSITIVE (*) NEGATIVE Final   Comment:            The GeneXpert MRSA Assay (FDA     approved for NASAL specimens     only), is one component of a     comprehensive MRSA colonization     surveillance program. It is not     intended to diagnose MRSA     infection nor to guide or     monitor treatment for     MRSA infections.     RESULT CALLED TO, READ BACK BY AND VERIFIED WITH:     LHUDSON RN AT 2215 ON [id number] BY DLONG     Labs: Basic Metabolic Panel:  Recent Labs Lab 06/21/14 1129 06/22/14 0335 06/24/14 0413  NA 144 141 141  K 4.3 4.0 3.7  CL 106 106 102  CO2 25 26 29   GLUCOSE 81 78 84  BUN 17 11 9   CREATININE 0.72 0.71 0.66  CALCIUM 9.0 8.3* 8.9   Liver Function Tests:  Recent Labs Lab 06/21/14 1129 06/22/14 0335  AST 29 20  ALT 13 10  ALKPHOS 57 46  BILITOT 0.3 0.3  PROT 6.3 5.0*  ALBUMIN 3.2* 2.6*   CBC:  Recent Labs Lab 06/21/14 1129 06/22/14 0335 06/24/14 0413  WBC 7.1 5.1 6.1  NEUTROABS 5.3  --   --   HGB 14.4 12.6 13.8  HCT 44.3 38.4 41.8  MCV 98.2 97.7 96.8  PLT 185 158 157   Cardiac Enzymes:  Recent Labs Lab 06/21/14 1534 06/21/14 2048 06/22/14 0335  TROPONINI <0.30 <0.30 <0.30   BNP: BNP (last 3 results) No results found for this basename: PROBNP,  in the last 8760 hours CBG:  Recent Labs Lab 06/21/14 0926  GLUCAP 74     SIGNED: Time coordinating discharge: Over 30 minutes  Debbora PrestoMAGICK-MYERS, ISKRA, MD  [facility] 06/24/2014, 10:24  AM Pager [phone]  If 7PM-7AM, please contact night-coverage [email or web address] Password [email or web address]

## 2014-06-24 NOTE — Progress Notes (Signed)
[facility]charge [facility]  [facility], [facility] to [facility]. Tried to call the husband Ree KidaJack [phone] nut no answer, left msg. PIV removed no s/s of swelling or infiltration noted.

## 2014-06-24 NOTE — Progress Notes (Signed)
Utilization review completed.  

## 2014-06-24 NOTE — Discharge Instructions (Signed)
Alzheimer Disease Alzheimer's Disease is a mental disorder. It causes memory loss and loss of other mental functions, such as learning, thinking, solving problems, communicating, and completing tasks. The mental losses interfere with the ability to perform daily activities at work, at home, or in social situations. Alzheimer's Disease usually starts in a person's late 60s or early 70s but can start earlier in life (familial form). The mental changes caused by this disease are permanent and worsen over time. As the illness progresses, the ability to do even the simplest things is lost. Survival with Alzheimer's Disease ranges from several years to as long as 20 years. CAUSES Alzheimer's Disease is caused by abnormally high levels of a protein (beta-amyloid) in the brain. This protein forms very small deposits within and around the brain's nerve cells. These deposits prevent the nerve cells from working properly. Experts are not certain what causes the beta-amyloid deposits in this disease. RISK FACTORS The following major risk factors have been identified:  Increasing age.  Certain genetic variations, such as Down syndrome (trisomy 21). SYMPTOMS In the early stages of Alzheimer's Disease, you are still able to perform daily activities but need greater effort, more time, or memory aids. Early symptoms include:  Mild memory loss of recent events, names, or phone numbers.  Loss of objects.  Minor loss of vocabulary.  Difficulty with complex tasks, such as paying bills or driving in unfamiliar locations. Other mental functions deteriorate as the disease worsens. These changes slowly go from mild to severe. Symptoms at this stage include:  Difficulty remembering. You may not be able to recall personal information such as your address and telephone number. You may become confused about the date, the season of the year, or your location.  Difficulty maintaining attention. You may forget what you  wanted to say during conversations and repeat what you have already said.  Difficulty learning new information or tasks. You may not remember what you read or the name of a new friend you met.  Difficulty counting or doing math. You may have difficulty with complex math problems. You may make mistakes in paying bills or managing your checkbook.  Poor reasoning and judgment. You may make poor decisions or not dress right for the weather.  Difficulty communicating. You may have regular difficulty remembering words, naming objects, expressing yourself clearly, or writing sentences that make sense.  Difficulty performing familiar daily activities. You may get lost driving in familiar locations or need help eating, bathing, dressing, grooming, or using the toilet. You may have difficulty maintaining bladder or bowel control.  Difficulty recognizing familiar faces. You may confuse family members or close friends with one another. You may not recognize a close relative or may mistake strangers for family. Alzheimer's Disease also may cause changes in personality and behavior. These changes include:   Loss of interest or motivation.  Social withdrawal.  Anxiety.  Difficulty sleeping.  Uncharacteristic anger or combativeness.  A false belief that someone is trying to harm you (paranoia).  Seeing things that are not real (hallucinations).  Agitation. Confusion and disruptive behavior are often worse at night and may be triggered by changes in the environment or acute medical issues. DIAGNOSIS  Alzheimer's Disease is diagnosed through an assessment by your health care provider. During this assessment, your health care provider will do the following:  Ask you and your family, friends, or caregiver questions about your symptoms, their frequency, their duration and progression, and the effect they are having on your life.    Ask questions about your personal and family medical history and use of  alcohol or drugs, including prescription medicine.  Perform a physical exam and order blood tests and brain imaging exams. Your health care provider may refer you to a specialist for detailed evaluation of your mental functions (neuropsychological testing).  Many different brain disorders, medical conditions, and certain substances can cause symptoms that resemble Alzheimer's Disease symptoms. These must be ruled out before this disease can be diagnosed. If Alzheimer's Disease is diagnosed, it will be considered either "possible" or "probable" Alzheimer's Disease. "Possible" Alzheimer's Disease means that your symptoms are typical of the disease and no other disorder is causing them. "Probable" Alzheimer's Disease means that you also have a family history of the disease or genetic test results that support the diagnosis. Certain tests, mostly used in research studies, are highly specific for Alzheimer's Disease.  TREATMENT  There is currently no cure for this disease. The goals of treatment are to:  Slow down the progression of the disease.  Preserve mental function as long as possible.  Manage behavioral symptoms.  Make life easier for the person with Alzheimer's Disease and his or her caregivers. The following treatment options are available:  Medicine. Certain medicines may help slow memory loss by changing the level of certain chemicals in the brain. Medicine may also help with behavioral symptoms.  Talk therapy. Talk therapy provides education, support, and memory aids for people with this disease. It is most effective in the early stages of the illness.  Caregiving. Caregivers may be family members, friends, or trained medical professionals. They help the person with Alzheimer's Disease with daily life activities. Caregiving may take place at home or at a nursing facility.  Family support groups. These provide education, emotional support, and information about community resources to  family members who are taking care of the person with this disease. Document Released: 08/28/2004 Document Revised: 12/22/2013 Document Reviewed: 04/24/2013 ExitCare Patient Information 2015 [employer]. This information is not intended to replace advice given to you by your health care provider. Make sure you discuss any questions you have with your health care provider.  

## 2014-06-24 NOTE — Progress Notes (Signed)
Patient is set to [facility] today. Patient & husband, Ree KidaJack aware. Discharge packet given to RN, Delorise ShinerGrace. [facility] that [facility] ok to return. [facility] called for transport.   Lincoln MaxinKelly Harrison, LCSW [facility] Clinical Social Worker cell #: [phone]

## 2014-06-28 NOTE — ED Provider Notes (Signed)
Medical screening examination/treatment/procedure(s) were conducted as a shared visit with non-physician practitioner(s) and myself.  I personally evaluated the patient during the encounter.   EKG Interpretation   Date/Time:  Monday June 21 2014 09:17:19 EDT Ventricular Rate:  59 PR Interval:  120 QRS Duration: 82 QT Interval:  436 QTC Calculation: 432 R Axis:   87 Text Interpretation:  Sinus rhythm Borderline right axis deviation No old  tracing to compare Confirmed by [facility]OFF[facility]  MD, TREY ([id number]) on 06/21/2014  11:11:28 AM      80 yo female presenting after witnessed syncope.  Helped to the floor, so no injuries.  On exam, pleasant, well appearing, alert, no respiratory distress, normal perfusion.  Plan admission for syncope.  Clinical Impression: 1. Syncope, unspecified syncope type   2. Dementia, without behavioral disturbance      Candyce ChurnJohn David [name] III, MD 06/28/14 [phone]

## 2014-06-28 NOTE — ED Provider Notes (Signed)
Medical screening examination/treatment/procedure(s) were conducted as a shared visit with non-physician practitioner(s) and myself.  I personally evaluated the patient during the encounter.   Please see my separate note.     John David [name] III, MD 06/28/14 0741 

## 2014-07-16 ENCOUNTER — Emergency Department (HOSPITAL_COMMUNITY)
Admission: EM | Admit: 2014-07-16 | Discharge: 2014-07-16 | Disposition: A | Discharge status: Home / Self Care | Payer: Medicare PPO | Attending: Emergency Medicine | Admitting: Emergency Medicine

## 2014-07-16 ENCOUNTER — Encounter (HOSPITAL_COMMUNITY): Payer: Self-pay | Admitting: Emergency Medicine

## 2014-07-16 DIAGNOSIS — Z87891 Personal history of nicotine dependence: Secondary | ICD-10-CM | POA: Diagnosis not present

## 2014-07-16 DIAGNOSIS — R404 Transient alteration of awareness: Secondary | ICD-10-CM | POA: Diagnosis present

## 2014-07-16 DIAGNOSIS — I498 Other specified cardiac arrhythmias: Secondary | ICD-10-CM | POA: Diagnosis not present

## 2014-07-16 DIAGNOSIS — R531 Weakness: Secondary | ICD-10-CM

## 2014-07-16 DIAGNOSIS — Z79899 Other long term (current) drug therapy: Secondary | ICD-10-CM | POA: Diagnosis not present

## 2014-07-16 DIAGNOSIS — F411 Generalized anxiety disorder: Secondary | ICD-10-CM | POA: Diagnosis not present

## 2014-07-16 DIAGNOSIS — G8929 Other chronic pain: Secondary | ICD-10-CM | POA: Insufficient documentation

## 2014-07-16 DIAGNOSIS — F329 Major depressive disorder, single episode, unspecified: Secondary | ICD-10-CM | POA: Insufficient documentation

## 2014-07-16 DIAGNOSIS — R5381 Other malaise: Secondary | ICD-10-CM | POA: Diagnosis not present

## 2014-07-16 DIAGNOSIS — F0390 Unspecified dementia without behavioral disturbance: Secondary | ICD-10-CM | POA: Diagnosis not present

## 2014-07-16 DIAGNOSIS — Z8719 Personal history of other diseases of the digestive system: Secondary | ICD-10-CM | POA: Insufficient documentation

## 2014-07-16 DIAGNOSIS — Z8739 Personal history of other diseases of the musculoskeletal system and connective tissue: Secondary | ICD-10-CM | POA: Diagnosis not present

## 2014-07-16 DIAGNOSIS — R5383 Other fatigue: Secondary | ICD-10-CM | POA: Diagnosis not present

## 2014-07-16 DIAGNOSIS — F3289 Other specified depressive episodes: Secondary | ICD-10-CM | POA: Diagnosis not present

## 2014-07-16 DIAGNOSIS — Z7982 Long term (current) use of aspirin: Secondary | ICD-10-CM | POA: Diagnosis not present

## 2014-07-16 LAB — CBC WITH DIFFERENTIAL/PLATELET
BASOS ABS: 0 10*3/uL (ref 0.0–0.1)
BASOS PCT: 0 % (ref 0–1)
EOS PCT: 0 % (ref 0–5)
Eosinophils Absolute: 0 10*3/uL (ref 0.0–0.7)
HCT: 42.9 % (ref 36.0–46.0)
Hemoglobin: 13.8 g/dL (ref 12.0–15.0)
LYMPHS PCT: 11 % — AB (ref 12–46)
Lymphs Abs: 0.9 10*3/uL (ref 0.7–4.0)
MCH: 31.9 pg (ref 26.0–34.0)
MCHC: 32.2 g/dL (ref 30.0–36.0)
MCV: 99.3 fL (ref 78.0–100.0)
Monocytes Absolute: 0.4 10*3/uL (ref 0.1–1.0)
Monocytes Relative: 5 % (ref 3–12)
Neutro Abs: 6.9 10*3/uL (ref 1.7–7.7)
Neutrophils Relative %: 84 % — ABNORMAL HIGH (ref 43–77)
PLATELETS: 182 10*3/uL (ref 150–400)
RBC: 4.32 MIL/uL (ref 3.87–5.11)
RDW: 13.3 % (ref 11.5–15.5)
WBC: 8.3 10*3/uL (ref 4.0–10.5)

## 2014-07-16 LAB — COMPREHENSIVE METABOLIC PANEL
ALK PHOS: 60 U/L (ref 39–117)
ALT: 11 U/L (ref 0–35)
ANION GAP: 10 (ref 5–15)
AST: 22 U/L (ref 0–37)
Albumin: 3.1 g/dL — ABNORMAL LOW (ref 3.5–5.2)
BILIRUBIN TOTAL: 0.3 mg/dL (ref 0.3–1.2)
BUN: 12 mg/dL (ref 6–23)
CHLORIDE: 105 meq/L (ref 96–112)
CO2: 28 meq/L (ref 19–32)
CREATININE: 0.78 mg/dL (ref 0.50–1.10)
Calcium: 8.1 mg/dL — ABNORMAL LOW (ref 8.4–10.5)
GFR calc Af Amer: 86 mL/min — ABNORMAL LOW (ref >90)
GFR, EST NON AFRICAN AMERICAN: 74 mL/min — AB (ref >90)
GLUCOSE: 98 mg/dL (ref 70–99)
Potassium: 4.5 mEq/L (ref 3.7–5.3)
Sodium: 143 mEq/L (ref 137–147)
Total Protein: 5.9 g/dL — ABNORMAL LOW (ref 6.0–8.3)

## 2014-07-16 LAB — URINALYSIS, ROUTINE W REFLEX MICROSCOPIC
Bilirubin Urine: NEGATIVE
GLUCOSE, UA: NEGATIVE mg/dL
HGB URINE DIPSTICK: NEGATIVE
KETONES UR: NEGATIVE mg/dL
Leukocytes, UA: NEGATIVE
Nitrite: NEGATIVE
PH: 7.5 (ref 5.0–8.0)
Protein, ur: NEGATIVE mg/dL
Specific Gravity, Urine: 1.013 (ref 1.005–1.030)
Urobilinogen, UA: 1 mg/dL (ref 0.0–1.0)

## 2014-07-16 LAB — POC OCCULT BLOOD, ED
FECAL OCCULT BLD: NEGATIVE
Fecal Occult Bld: NEGATIVE

## 2014-07-16 MED ORDER — SODIUM CHLORIDE 0.9 % IV BOLUS (SEPSIS): 1000.0000 mL | Freq: Once | INTRAVENOUS | Status: DC

## 2014-07-16 MED ORDER — SODIUM CHLORIDE 0.9 % IV BOLUS (SEPSIS)
1000.0000 mL | Freq: Once | INTRAVENOUS | Status: AC
  Administered 2014-07-16: 1000 mL via INTRAVENOUS

## 2014-07-16 NOTE — ED Notes (Addendum)
[facility] from [facility] ([facility] care). Pt with PTA helping her get dress and when she stood up, pt started to lean forward like she was going to pass out. Pt was sat down and she had another episode. Pt laid in trendelenburg. Pt does not remember this happening. But was sitting down and with staff- no fall or injury occurred. cbg 110. bp 98/60. Positive orthostatic vs. No cardiac hx. 12 lead showed some premature beats with ems but subsided after fluid bolus. ems administered 1000cc ns pta.

## 2014-07-16 NOTE — ED Notes (Signed)
Md at bedside. Attempted to stand pt up. Pt very unsteady on feet.

## 2014-07-16 NOTE — Discharge Instructions (Signed)
Encourage Ms. Morgan Macdonald to drink for 6 eight ounce containers of fluid per day. We feel that she is likely dehydrated. Encourage her to eat. Have her eturn or call Dr.Tripp if her condition worsens for any reason

## 2014-07-16 NOTE — ED Provider Notes (Signed)
CSN: [id number]     Arrival date & time 07/16/14  1300 History   First MD Initiated Contact with Patient 07/16/14 1322     Chief Complaint  Patient presents with  . Loss of Consciousness     (Consider location/radiation/quality/duration/timing/severity/associated sxs/prior Treatment) HPI Level V caveat dementia. History is obtained from [place], med technician via telephone Patient had episode of generalized weakness today. She has been eating less over the past few days. She was treated by EMS with normal saline intravenous bolus. She is presently asymptomatic. Past Medical History  Diagnosis Date  . Anxiety   . Dementia   . Depression   . GERD (gastroesophageal reflux disease)   . Chronic low back pain   . Osteoporosis   . OSA (obstructive sleep apnea)     NOT ON CPAP   Past Surgical History  Procedure Laterality Date  . Upper gi endoscopy  05/18/2011  . Abdominal hysterectomy      TAH/BSO  . Epidural steroid injections      02/26/2000, 03/13/2000   No family history on file. History  Substance Use Topics  . Smoking status: Former Games developer  . Smokeless tobacco: Never Used  . Alcohol Use: No   DO NOT RESUSCITATE CODE STATUS OB History   Grav Para Term Preterm Abortions TAB SAB Ect Mult Living                 Review of Systems  Unable to perform ROS: Dementia      Allergies  Review of patient's allergies indicates no known allergies.  Home Medications   Prior to Admission medications   Medication Sig Start Date End Date Taking? Authorizing Provider  aspirin 81 MG chewable tablet Chew 81 mg by mouth daily.    Yes Historical Provider, MD  divalproex (DEPAKOTE ER) 250 MG 24 hr tablet Take 750 mg by mouth every morning.   Yes Historical Provider, MD  donepezil (ARICEPT) 10 MG tablet Take 10 mg by mouth at bedtime.   Yes Historical Provider, MD  ENSURE (ENSURE) Take 237 mLs by mouth 2 (two) times daily between meals.   Yes Historical Provider, MD  LORazepam (ATIVAN)  0.5 MG tablet Take 0.5 tablets (0.25 mg total) by mouth daily as needed for anxiety. 06/24/14  Yes Dorothea Ogle, MD  Memantine HCl ER (NAMENDA XR) 28 MG CP24 Take 28 mg by mouth every morning.   Yes Historical Provider, MD  Multiple Vitamins-Minerals (MULTIVITAMIN WITH MINERALS) tablet Take 1 tablet by mouth daily.   Yes Historical Provider, MD  sertraline (ZOLOFT) 50 MG tablet Take 50 mg by mouth every morning.   Yes Historical Provider, MD  vitamin E 400 UNIT capsule Take 400 Units by mouth every morning.   Yes Historical Provider, MD   BP 151/59  Pulse 58  Temp(Src) 97.4 F (36.3 C) (Oral)  Resp 15  SpO2 100% Physical Exam  Nursing note and vitals reviewed. Constitutional: She appears well-developed and well-nourished. No distress.  HENT:  Head: Normocephalic and atraumatic.  Eyes: Conjunctivae are normal. Pupils are equal, round, and reactive to light.  Neck: Neck supple. No tracheal deviation present. No thyromegaly present.  Cardiovascular: Regular rhythm.   No murmur heard. Mildly bradycardic  Pulmonary/Chest: Effort normal and breath sounds normal.  Abdominal: Soft. Bowel sounds are normal. She exhibits no distension. There is no tenderness.  Genitourinary: Guaiac negative stool.  Rectal normal tone brown stool  Musculoskeletal: Normal range of motion. She exhibits no edema and no tenderness.  Neurological: She is alert. Coordination normal.  Skin: Skin is warm and dry. No rash noted.  Psychiatric: She has a normal mood and affect.    ED Course  Procedures (including critical care time) Labs Review Labs Reviewed  COMPREHENSIVE METABOLIC PANEL - Abnormal; Notable for the following:    Calcium 8.1 (*)    Total Protein 5.9 (*)    Albumin 3.1 (*)    GFR calc non Af Amer 74 (*)    GFR calc Af Amer 86 (*)    All other components within normal limits  CBC WITH DIFFERENTIAL - Abnormal; Notable for the following:    Neutrophils Relative % 84 (*)    Lymphocytes Relative 11  (*)    All other components within normal limits  URINALYSIS, ROUTINE W REFLEX MICROSCOPIC  POC OCCULT BLOOD, ED    Imaging Review No results found.   EKG Interpretation None      Date: 07/16/2014  Rate: 55  Rhythm: sinus bradycardia  QRS Axis: normal  Intervals: normal  ST/T Wave abnormalities: normal  Conduction Disutrbances:none  Narrative Interpretation:   Old EKG Reviewed: Unchanged from 06/21/2012 interpreted by me Results for orders placed during the hospital encounter of 07/16/14  URINALYSIS, ROUTINE W REFLEX MICROSCOPIC      Result Value Ref Range   Color, Urine YELLOW  YELLOW   APPearance CLEAR  CLEAR   Specific Gravity, Urine 1.013  1.005 - 1.030   pH 7.5  5.0 - 8.0   Glucose, UA NEGATIVE  NEGATIVE mg/dL   Hgb urine dipstick NEGATIVE  NEGATIVE   Bilirubin Urine NEGATIVE  NEGATIVE   Ketones, ur NEGATIVE  NEGATIVE mg/dL   Protein, ur NEGATIVE  NEGATIVE mg/dL   Urobilinogen, UA 1.0  0.0 - 1.0 mg/dL   Nitrite NEGATIVE  NEGATIVE   Leukocytes, UA NEGATIVE  NEGATIVE  COMPREHENSIVE METABOLIC PANEL      Result Value Ref Range   Sodium 143  137 - 147 mEq/L   Potassium 4.5  3.7 - 5.3 mEq/L   Chloride 105  96 - 112 mEq/L   CO2 28  19 - 32 mEq/L   Glucose, Bld 98  70 - 99 mg/dL   BUN 12  6 - 23 mg/dL   Creatinine, Ser 1.61  0.50 - 1.10 mg/dL   Calcium 8.1 (*) 8.4 - 10.5 mg/dL   Total Protein 5.9 (*) 6.0 - 8.3 g/dL   Albumin 3.1 (*) 3.5 - 5.2 g/dL   AST 22  0 - 37 U/L   ALT 11  0 - 35 U/L   Alkaline Phosphatase 60  39 - 117 U/L   Total Bilirubin 0.3  0.3 - 1.2 mg/dL   GFR calc non Af Amer 74 (*) >90 mL/min   GFR calc Af Amer 86 (*) >90 mL/min   Anion gap 10  5 - 15  CBC WITH DIFFERENTIAL      Result Value Ref Range   WBC 8.3  4.0 - 10.5 K/uL   RBC 4.32  3.87 - 5.11 MIL/uL   Hemoglobin 13.8  12.0 - 15.0 g/dL   HCT 09.6  04.5 - 40.9 %   MCV 99.3  78.0 - 100.0 fL   MCH 31.9  26.0 - 34.0 pg   MCHC 32.2  30.0 - 36.0 g/dL   RDW 81.1  91.4 - 78.2 %   Platelets  182  150 - 400 K/uL   Neutrophils Relative % 84 (*) 43 - 77 %   Neutro  Abs 6.9  1.7 - 7.7 K/uL   Lymphocytes Relative 11 (*) 12 - 46 %   Lymphs Abs 0.9  0.7 - 4.0 K/uL   Monocytes Relative 5  3 - 12 %   Monocytes Absolute 0.4  0.1 - 1.0 K/uL   Eosinophils Relative 0  0 - 5 %   Eosinophils Absolute 0.0  0.0 - 0.7 K/uL   Basophils Relative 0  0 - 1 %   Basophils Absolute 0.0  0.0 - 0.1 K/uL   Ct Head Wo Contrast  06/21/2014   CLINICAL DATA:  Syncope with dizziness.  Dementia.  EXAM: CT HEAD WITHOUT CONTRAST  TECHNIQUE: Contiguous axial images were obtained from the base of the skull through the vertex without intravenous contrast.  COMPARISON:  05/20/2012.  FINDINGS: There is no evidence for acute hemorrhage, hydrocephalus, mass lesion, or abnormal extra-axial fluid collection. No definite CT evidence for acute infarction. Diffuse loss of parenchymal volume is consistent with atrophy. Patchy low attenuation in the deep hemispheric and periventricular white matter is nonspecific, but likely reflects chronic microvascular ischemic demyelination. Chronic lacunar infarcts are seen in the right basal ganglia.  The visualized paranasal sinuses and mastoid air cells are clear.  IMPRESSION: Stable.  No acute intracranial abnormality.  Atrophy with chronic small vessel white matter ischemic demyelination.   Electronically Signed   By: Kennith CenterEric  Mansell M.D.   On: 06/21/2014 14:04    5 PM patient states "I feel good." patient is resting comfortably. MDM  Weakness felt secondary mild dehydration. Plan return to memory care unit encourage oral intake I spoke with patient's husband who is in agreement with plan Final diagnoses:  None   Diagnosis weakness     Doug SouSam Berlin Viereck, MD 07/16/14 1714

## 2014-07-16 NOTE — ED Notes (Signed)
Pt noted to be up out of bed and urinated in floor. Pt placed back in bed, uninjured. Placed back on monitor and resting comfortably

## 2014-10-10 ENCOUNTER — Encounter (HOSPITAL_COMMUNITY): Payer: Self-pay | Admitting: Emergency Medicine

## 2014-10-10 ENCOUNTER — Emergency Department (HOSPITAL_COMMUNITY): Payer: Medicare PPO

## 2014-10-10 ENCOUNTER — Emergency Department (HOSPITAL_COMMUNITY)
Admission: EM | Admit: 2014-10-10 | Discharge: 2014-10-11 | Disposition: A | Discharge status: Home / Self Care | Payer: Medicare PPO | Attending: Emergency Medicine | Admitting: Emergency Medicine

## 2014-10-10 DIAGNOSIS — F0390 Unspecified dementia without behavioral disturbance: Secondary | ICD-10-CM | POA: Diagnosis not present

## 2014-10-10 DIAGNOSIS — Z8739 Personal history of other diseases of the musculoskeletal system and connective tissue: Secondary | ICD-10-CM | POA: Diagnosis not present

## 2014-10-10 DIAGNOSIS — F419 Anxiety disorder, unspecified: Secondary | ICD-10-CM | POA: Insufficient documentation

## 2014-10-10 DIAGNOSIS — Y9210 Unspecified residential institution as the place of occurrence of the external cause: Secondary | ICD-10-CM | POA: Diagnosis not present

## 2014-10-10 DIAGNOSIS — W01198A Fall on same level from slipping, tripping and stumbling with subsequent striking against other object, initial encounter: Secondary | ICD-10-CM | POA: Diagnosis not present

## 2014-10-10 DIAGNOSIS — G8929 Other chronic pain: Secondary | ICD-10-CM | POA: Diagnosis not present

## 2014-10-10 DIAGNOSIS — Z043 Encounter for examination and observation following other accident: Secondary | ICD-10-CM | POA: Insufficient documentation

## 2014-10-10 DIAGNOSIS — Z8719 Personal history of other diseases of the digestive system: Secondary | ICD-10-CM | POA: Diagnosis not present

## 2014-10-10 DIAGNOSIS — Y9389 Activity, other specified: Secondary | ICD-10-CM | POA: Insufficient documentation

## 2014-10-10 DIAGNOSIS — R05 Cough: Secondary | ICD-10-CM | POA: Diagnosis not present

## 2014-10-10 DIAGNOSIS — Z79899 Other long term (current) drug therapy: Secondary | ICD-10-CM | POA: Diagnosis not present

## 2014-10-10 DIAGNOSIS — Z7982 Long term (current) use of aspirin: Secondary | ICD-10-CM | POA: Diagnosis not present

## 2014-10-10 DIAGNOSIS — F329 Major depressive disorder, single episode, unspecified: Secondary | ICD-10-CM | POA: Insufficient documentation

## 2014-10-10 DIAGNOSIS — Z87891 Personal history of nicotine dependence: Secondary | ICD-10-CM | POA: Insufficient documentation

## 2014-10-10 DIAGNOSIS — W19XXXA Unspecified fall, initial encounter: Secondary | ICD-10-CM

## 2014-10-10 NOTE — ED Notes (Signed)
Pt at radiology  ?

## 2014-10-10 NOTE — ED Notes (Signed)
Bed: WA05 Expected date:  Expected time:  Means of arrival:  Comments: EMS-fall 

## 2014-10-10 NOTE — ED Provider Notes (Signed)
CSN: [id number]     Arrival date & time 10/10/14  1823 History   First MD Initiated Contact with Patient 10/10/14 1828     Chief Complaint  Patient presents with  . Fall     (Consider location/radiation/quality/duration/timing/severity/associated sxs/prior Treatment) HPI Comments: The patient is an 80 year old female with history of dementia, depression, GERD, osteoporosis who presents the emergency department today after a fall. Per her assisted-living facility, the fall was witnessed. She was walking in the hall with her walker when she lost her balance. If she fell and hit her back and the back of her head. She had full range of motion after the fall and is acting at her baseline. On my exam the patient appears to have a mild cough. She has been developing cold-like symptoms over the past 2 days per her nurse at her assisted living facility. Patient has no complaints at this time.  Patient is a 80 y.o. female presenting with fall. The history is provided by the spouse and a caregiver. No language interpreter was used.  Fall    Past Medical History  Diagnosis Date  . Anxiety   . Dementia   . Depression   . GERD (gastroesophageal reflux disease)   . Chronic low back pain   . Osteoporosis   . OSA (obstructive sleep apnea)     NOT ON CPAP   Past Surgical History  Procedure Laterality Date  . Upper gi endoscopy  05/18/2011  . Abdominal hysterectomy      TAH/BSO  . Epidural steroid injections      02/26/2000, 03/13/2000   No family history on file. History  Substance Use Topics  . Smoking status: Former Games developermoker  . Smokeless tobacco: Never Used  . Alcohol Use: No   OB History   Grav Para Term Preterm Abortions TAB SAB Ect Mult Living                 Review of Systems  Unable to perform ROS: Dementia      Allergies  Review of patient's allergies indicates no known allergies.  Home Medications   Prior to Admission medications   Medication Sig Start Date End Date  Taking? Authorizing Provider  aspirin 81 MG chewable tablet Chew 81 mg by mouth daily.     Historical Provider, MD  divalproex (DEPAKOTE ER) 250 MG 24 hr tablet Take 750 mg by mouth every morning.    Historical Provider, MD  donepezil (ARICEPT) 10 MG tablet Take 10 mg by mouth at bedtime.    Historical Provider, MD  ENSURE (ENSURE) Take 237 mLs by mouth 2 (two) times daily between meals.    Historical Provider, MD  LORazepam (ATIVAN) 0.5 MG tablet Take 0.5 tablets (0.25 mg total) by mouth daily as needed for anxiety. 06/24/14   Dorothea OgleIskra M Myers, MD  Memantine HCl ER (NAMENDA XR) 28 MG CP24 Take 28 mg by mouth every morning.    Historical Provider, MD  Multiple Vitamins-Minerals (MULTIVITAMIN WITH MINERALS) tablet Take 1 tablet by mouth daily.    Historical Provider, MD  sertraline (ZOLOFT) 50 MG tablet Take 50 mg by mouth every morning.    Historical Provider, MD  vitamin E 400 UNIT capsule Take 400 Units by mouth every morning.    Historical Provider, MD   BP 141/73  Pulse 74  Temp(Src) 98.4 F (36.9 C) (Oral)  Resp 16  SpO2 100% Physical Exam  Nursing note and vitals reviewed. Constitutional: She appears well-developed and well-nourished. No distress.  HENT:  Head: Normocephalic and atraumatic.  Right Ear: External ear normal.  Left Ear: External ear normal.  Nose: Nose normal.  Mouth/Throat: Oropharynx is clear and moist.  No broken or loose teeth  Eyes: Conjunctivae and EOM are normal. Pupils are equal, round, and reactive to light.  Neck: Normal range of motion. No spinous process tenderness and no muscular tenderness present.  Cardiovascular: Normal rate, regular rhythm, normal heart sounds, intact distal pulses and normal pulses.   Pulses:      Radial pulses are 2+ on the right side, and 2+ on the left side.       Posterior tibial pulses are 2+ on the right side, and 2+ on the left side.  Pulmonary/Chest: Effort normal and breath sounds normal. No stridor. No respiratory distress.  She has no wheezes. She has no rales.  Abdominal: Soft. She exhibits no distension. There is no tenderness.  Musculoskeletal: Normal range of motion.  Moves all extremities without ataxia or guarding   Neurological: She is alert. She has normal strength. No sensory deficit.  Skin: Skin is warm and dry. She is not diaphoretic. No erythema.  Psychiatric: She has a normal mood and affect. Her behavior is normal.    ED Course  Procedures (including critical care time) Labs Review Labs Reviewed - No data to display  Imaging Review Dg Chest 2 View  10/10/2014   CLINICAL DATA:  Fall trying to sit down at nursing home.  Dementia.  EXAM: CHEST  2 VIEW  COMPARISON:  None.  FINDINGS: Heart size at the upper limits of normal. Both lungs are clear. No evidence of pleural effusion. No mass or lymphadenopathy identified. Thoracic spine degenerative changes noted.  IMPRESSION: No active cardiopulmonary disease.   Electronically Signed   By: Myles Rosenthal M.D.   On: 10/10/2014 20:09   Ct Head Wo Contrast  10/10/2014   CLINICAL DATA:  History of dementia.  Post fall.  Initial encounter.  EXAM: CT HEAD WITHOUT CONTRAST  CT CERVICAL SPINE WITHOUT CONTRAST  TECHNIQUE: Multidetector CT imaging of the head and cervical spine was performed following the standard protocol without intravenous contrast. Multiplanar CT image reconstructions of the cervical spine were also generated.  COMPARISON:  06/21/2014; 05/20/2012  FINDINGS: CT HEAD FINDINGS  Re- demonstrated advanced atrophy with sulcal prominence and centralized volume loss with commensurate ex vacuo dilatation of the ventricular system. Extensive periventricular hypodensities compatible with microvascular ischemic disease. Unchanged lacunar infarct within the right basal ganglia (image 14, series 2). Given extensive background parenchymal abnormalities, there is no CT evidence of acute large territory infarct. No intraparenchymal or extra-axial mass or hemorrhage.  Unchanged size and configuration of the ventricles and basilar cisterns. Intracranial atherosclerosis.  Limited visualization the paranasal sinuses and mastoid air cells is normal. Regional soft tissues appear normal. Post bilateral cataract surgery. No displaced calvarial fracture.  CT CERVICAL SPINE FINDINGS  C1 to the superior endplate of T2 is imaged.  There is mild (approximately 3 mm) of anterolisthesis of C3 upon C4. The dens is normally positioned between the lateral masses of C1. Mild atlanto dental degenerative change. Normal atlantoaxial articulations.  No fracture or static subluxation of the cervical spine. Cervical vertebral body heights are preserved. Prevertebral soft tissues are normal.  There is mild-to-moderate multilevel cervical spine DDD, worse at C5-C6 and to a lesser extent, C4-C5 and C6-C7 with disc space height loss, endplate irregularity and sclerosis.  Atherosclerotic plaque within the bilateral carotid bulbs.  Dystrophic calcification within the right  lobe of the thyroid measures approximately 0.3 cm in diameter. No bulky cervical lymphadenopathy on this noncontrast examination.  Limited visualization of lung apices demonstrates grossly symmetric biapical pleural parenchymal thickening.  IMPRESSION: 1. Advanced atrophy and microvascular ischemic disease without acute intracranial process. 2. No fracture or static subluxation of the cervical spine. 3. Mild-to-moderate multilevel cervical spine DDD, worse at C5-C6.   Electronically Signed   By: Simonne ComeJohn  Watts M.D.   On: 10/10/2014 20:34   Ct Cervical Spine Wo Contrast  10/10/2014   CLINICAL DATA:  History of dementia.  Post fall.  Initial encounter.  EXAM: CT HEAD WITHOUT CONTRAST  CT CERVICAL SPINE WITHOUT CONTRAST  TECHNIQUE: Multidetector CT imaging of the head and cervical spine was performed following the standard protocol without intravenous contrast. Multiplanar CT image reconstructions of the cervical spine were also generated.   COMPARISON:  06/21/2014; 05/20/2012  FINDINGS: CT HEAD FINDINGS  Re- demonstrated advanced atrophy with sulcal prominence and centralized volume loss with commensurate ex vacuo dilatation of the ventricular system. Extensive periventricular hypodensities compatible with microvascular ischemic disease. Unchanged lacunar infarct within the right basal ganglia (image 14, series 2). Given extensive background parenchymal abnormalities, there is no CT evidence of acute large territory infarct. No intraparenchymal or extra-axial mass or hemorrhage. Unchanged size and configuration of the ventricles and basilar cisterns. Intracranial atherosclerosis.  Limited visualization the paranasal sinuses and mastoid air cells is normal. Regional soft tissues appear normal. Post bilateral cataract surgery. No displaced calvarial fracture.  CT CERVICAL SPINE FINDINGS  C1 to the superior endplate of T2 is imaged.  There is mild (approximately 3 mm) of anterolisthesis of C3 upon C4. The dens is normally positioned between the lateral masses of C1. Mild atlanto dental degenerative change. Normal atlantoaxial articulations.  No fracture or static subluxation of the cervical spine. Cervical vertebral body heights are preserved. Prevertebral soft tissues are normal.  There is mild-to-moderate multilevel cervical spine DDD, worse at C5-C6 and to a lesser extent, C4-C5 and C6-C7 with disc space height loss, endplate irregularity and sclerosis.  Atherosclerotic plaque within the bilateral carotid bulbs.  Dystrophic calcification within the right lobe of the thyroid measures approximately 0.3 cm in diameter. No bulky cervical lymphadenopathy on this noncontrast examination.  Limited visualization of lung apices demonstrates grossly symmetric biapical pleural parenchymal thickening.  IMPRESSION: 1. Advanced atrophy and microvascular ischemic disease without acute intracranial process. 2. No fracture or static subluxation of the cervical spine.  3. Mild-to-moderate multilevel cervical spine DDD, worse at C5-C6.   Electronically Signed   By: Simonne ComeJohn  Watts M.D.   On: 10/10/2014 20:34     EKG Interpretation None      MDM   Final diagnoses:  Fall, initial encounter    Patient presents emergency department after a fall. Fall was witness, pt lost balance and fell. No LOC. Patient is currently at baseline. No tenderness to palpation or complaints of pain anywhere in body. Head and cervical spine CT are unremarkable. CXR was done as patient has mild cough. Per nursing home they believe she is developing cold. No pneumonia seen, lungs clear. Patient is well appearing and afebrile. Will discharge back to nursing facility in good condition. Dr. Silverio LayYao evaluated patient and agrees with plan. Patient / Family / Caregiver informed of clinical course, understand medical decision-making process, and agree with plan.     Mora BellmanHannah S Simora Dingee, PA-C 10/11/14 2222

## 2014-10-10 NOTE — ED Notes (Signed)
[facility] called for transport.  

## 2014-10-10 NOTE — Discharge Instructions (Signed)
Fall Prevention and Home Safety °Falls cause injuries and can affect all age groups. It is possible to prevent falls.  °HOW TO PREVENT FALLS °· Wear shoes with rubber soles that do not have an opening for your toes. °· Keep the inside and outside of your house well lit. °· Use night lights throughout your home. °· Remove clutter from floors. °· Clean up floor spills. °· Remove throw rugs or fasten them to the floor with carpet tape. °· Do not place electrical cords across pathways. °· Put grab bars by your tub, shower, and toilet. Do not use towel bars as grab bars. °· Put handrails on both sides of the stairway. Fix loose handrails. °· Do not climb on stools or stepladders, if possible. °· Do not wax your floors. °· Repair uneven or unsafe sidewalks, walkways, or stairs. °· Keep items you use a lot within reach. °· Be aware of pets. °· Keep emergency numbers next to the telephone. °· Put smoke detectors in your home and near bedrooms. °Ask your doctor what other things you can do to prevent falls. °Document Released: 10/13/2009 Document Revised: 06/17/2012 Document Reviewed: 03/18/2012 °[employer]® Patient Information ©2015 [employer]. This information is not intended to replace advice given to you by your health care provider. Make sure you discuss any questions you have with your health care provider. ° °

## 2014-10-10 NOTE — ED Notes (Signed)
Per EMS report: [facility] pt was walking around in the unit.  Staff thinks pt was trying to sit down when there was no chair and pt landed on the floor.  EMS found no injuries. According to staff, pt's mentation is at her baseline.  Pt hx of dementia.  Pt a/o x 2. NAD noted.

## 2014-10-12 NOTE — ED Provider Notes (Signed)
Medical screening examination/treatment/procedure(s) were conducted as a shared visit with non-physician practitioner(s) and myself.  I personally evaluated the patient during the encounter.   EKG Interpretation None      Morgan Macdonald is a 80 y.o. female hx of dementia here with fall. Witness fall at assisted living facility. Has head injury. Has some cough for several days. On exam, chronically ill. No obvious scalp hematoma. Heart, lung, abdomen unremarkable. No obvious extremity trauma. CT head/neck unremarkable, cxr showed no pneumonia. Stable for d/c.    Richardean Canalavid H Corra Kaine, MD 10/12/14 [phone]

## 2015-02-11 ENCOUNTER — Emergency Department (HOSPITAL_COMMUNITY): Payer: Medicare HMO

## 2015-02-11 ENCOUNTER — Encounter (HOSPITAL_COMMUNITY): Payer: Self-pay | Admitting: Emergency Medicine

## 2015-02-11 ENCOUNTER — Emergency Department (HOSPITAL_COMMUNITY)
Admission: EM | Admit: 2015-02-11 | Discharge: 2015-02-11 | Disposition: A | Discharge status: Home / Self Care | Payer: Medicare HMO | Attending: Emergency Medicine | Admitting: Emergency Medicine

## 2015-02-11 DIAGNOSIS — W1839XA Other fall on same level, initial encounter: Secondary | ICD-10-CM | POA: Insufficient documentation

## 2015-02-11 DIAGNOSIS — Y998 Other external cause status: Secondary | ICD-10-CM | POA: Insufficient documentation

## 2015-02-11 DIAGNOSIS — W19XXXA Unspecified fall, initial encounter: Secondary | ICD-10-CM

## 2015-02-11 DIAGNOSIS — Y92128 Other place in nursing home as the place of occurrence of the external cause: Secondary | ICD-10-CM | POA: Insufficient documentation

## 2015-02-11 DIAGNOSIS — S300XXA Contusion of lower back and pelvis, initial encounter: Secondary | ICD-10-CM | POA: Insufficient documentation

## 2015-02-11 DIAGNOSIS — Z7982 Long term (current) use of aspirin: Secondary | ICD-10-CM | POA: Insufficient documentation

## 2015-02-11 DIAGNOSIS — Z8719 Personal history of other diseases of the digestive system: Secondary | ICD-10-CM | POA: Diagnosis not present

## 2015-02-11 DIAGNOSIS — Z79899 Other long term (current) drug therapy: Secondary | ICD-10-CM | POA: Insufficient documentation

## 2015-02-11 DIAGNOSIS — Y9389 Activity, other specified: Secondary | ICD-10-CM | POA: Insufficient documentation

## 2015-02-11 DIAGNOSIS — M810 Age-related osteoporosis without current pathological fracture: Secondary | ICD-10-CM | POA: Diagnosis not present

## 2015-02-11 DIAGNOSIS — F0390 Unspecified dementia without behavioral disturbance: Secondary | ICD-10-CM | POA: Diagnosis not present

## 2015-02-11 DIAGNOSIS — S3992XA Unspecified injury of lower back, initial encounter: Secondary | ICD-10-CM | POA: Diagnosis present

## 2015-02-11 HISTORY — DX: Syncope and collapse: R55

## 2015-02-11 LAB — TROPONIN I

## 2015-02-11 LAB — CBC WITH DIFFERENTIAL/PLATELET
BASOS PCT: 0 % (ref 0–1)
Basophils Absolute: 0 10*3/uL (ref 0.0–0.1)
Eosinophils Absolute: 0 10*3/uL (ref 0.0–0.7)
Eosinophils Relative: 1 % (ref 0–5)
HCT: 41.2 % (ref 36.0–46.0)
Hemoglobin: 13.2 g/dL (ref 12.0–15.0)
Lymphocytes Relative: 29 % (ref 12–46)
Lymphs Abs: 1.7 10*3/uL (ref 0.7–4.0)
MCH: 32.2 pg (ref 26.0–34.0)
MCHC: 32 g/dL (ref 30.0–36.0)
MCV: 100.5 fL — ABNORMAL HIGH (ref 78.0–100.0)
Monocytes Absolute: 0.5 10*3/uL (ref 0.1–1.0)
Monocytes Relative: 9 % (ref 3–12)
Neutro Abs: 3.7 10*3/uL (ref 1.7–7.7)
Neutrophils Relative %: 61 % (ref 43–77)
PLATELETS: 183 10*3/uL (ref 150–400)
RBC: 4.1 MIL/uL (ref 3.87–5.11)
RDW: 13 % (ref 11.5–15.5)
WBC: 5.9 10*3/uL (ref 4.0–10.5)

## 2015-02-11 LAB — URINALYSIS, ROUTINE W REFLEX MICROSCOPIC
Bilirubin Urine: NEGATIVE
GLUCOSE, UA: NEGATIVE mg/dL
Hgb urine dipstick: NEGATIVE
KETONES UR: NEGATIVE mg/dL
Leukocytes, UA: NEGATIVE
NITRITE: NEGATIVE
Protein, ur: NEGATIVE mg/dL
Specific Gravity, Urine: 1.014 (ref 1.005–1.030)
Urobilinogen, UA: 1 mg/dL (ref 0.0–1.0)
pH: 7 (ref 5.0–8.0)

## 2015-02-11 LAB — COMPREHENSIVE METABOLIC PANEL
ALBUMIN: 3.3 g/dL — AB (ref 3.5–5.2)
ALK PHOS: 56 U/L (ref 39–117)
ALT: 13 U/L (ref 0–35)
AST: 24 U/L (ref 0–37)
Anion gap: 6 (ref 5–15)
BUN: 18 mg/dL (ref 6–23)
CHLORIDE: 105 mmol/L (ref 96–112)
CO2: 32 mmol/L (ref 19–32)
Calcium: 8.8 mg/dL (ref 8.4–10.5)
Creatinine, Ser: 0.81 mg/dL (ref 0.50–1.10)
GFR calc Af Amer: 74 mL/min — ABNORMAL LOW (ref >90)
GFR calc non Af Amer: 64 mL/min — ABNORMAL LOW (ref >90)
Glucose, Bld: 96 mg/dL (ref 70–99)
POTASSIUM: 4.3 mmol/L (ref 3.5–5.1)
SODIUM: 143 mmol/L (ref 135–145)
Total Bilirubin: 0.4 mg/dL (ref 0.3–1.2)
Total Protein: 6.4 g/dL (ref 6.0–8.3)

## 2015-02-11 LAB — VALPROIC ACID LEVEL: Valproic Acid Lvl: 64.9 ug/mL (ref 50.0–100.0)

## 2015-02-11 MED ORDER — SODIUM CHLORIDE 0.9 % IV BOLUS (SEPSIS)
1000.0000 mL | Freq: Once | INTRAVENOUS | Status: AC
  Administered 2015-02-11: 1000 mL via INTRAVENOUS

## 2015-02-11 MED ORDER — HYDROXYZINE HCL 10 MG PO TABS
10.0000 mg | ORAL_TABLET | Freq: Once | ORAL | Status: AC
  Administered 2015-02-11: 10 mg via ORAL
  Filled 2015-02-11: qty 1

## 2015-02-11 NOTE — ED Provider Notes (Signed)
CSN: [id number]     Arrival date & time 02/11/15  1254 History   First MD Initiated Contact with Patient 02/11/15 1411     Chief Complaint  Patient presents with  . Fall     (Consider location/radiation/quality/duration/timing/severity/associated sxs/prior Treatment) The history is provided by the EMS personnel. No language interpreter was used.  Morgan Macdonald is an 81 y/o F from a nursing home with PMHx of dementia, osteoporosis, GERD presenting to the ED with a unwitnessed fall that occurred today. Unknown time down. As per EMS report, patient was found in her room by the staff. As per report, stated that patient has a small bruise on her back. ROS limited secondary to patient having history of dementia.  PCP none  Level V Caveat   Past Medical History  Diagnosis Date  . Dementia   . Osteoporosis   . GERD (gastroesophageal reflux disease)   . Syncopal episodes    History reviewed. No pertinent past surgical history. History reviewed. No pertinent family history. History  Substance Use Topics  . Smoking status: Not on file  . Smokeless tobacco: Not on file  . Alcohol Use: Not on file   OB History    No data available     Review of Systems  Unable to perform ROS: Dementia      Allergies  Review of patient's allergies indicates no known allergies.  Home Medications   Prior to Admission medications   Medication Sig Start Date End Date Taking? Authorizing Provider  aspirin EC 81 MG tablet Take 81 mg by mouth daily.   Yes Historical Provider, MD  cholecalciferol (VITAMIN D) 400 UNITS TABS tablet Take 400 Units by mouth daily.   Yes Historical Provider, MD  divalproex (DEPAKOTE ER) 500 MG 24 hr tablet Take 500 mg by mouth daily.   Yes Historical Provider, MD  donepezil (ARICEPT) 5 MG tablet Take 5 mg by mouth at bedtime.   Yes Historical Provider, MD  feeding supplement, ENSURE COMPLETE, (ENSURE COMPLETE) LIQD Take 237 mLs by mouth 2 (two) times daily between meals.  Vanilla   Yes Historical Provider, MD  liver oil-zinc oxide (DESITIN) 40 % ointment Apply 1 application topically as needed (incontinent change will healed.).   Yes Historical Provider, MD  LORazepam (ATIVAN) 0.5 MG tablet Take 0.5 mg by mouth daily as needed for anxiety.   Yes Historical Provider, MD  memantine (NAMENDA XR) 28 MG CP24 24 hr capsule Take 28 mg by mouth daily.   Yes Historical Provider, MD  Multiple Vitamins-Iron (MULTIVITAMINS WITH IRON) TABS tablet Take 1 tablet by mouth daily.   Yes Historical Provider, MD  sertraline (ZOLOFT) 25 MG tablet Take 25 mg by mouth daily.   Yes Historical Provider, MD   BP 122/64 mmHg  Pulse 58  Temp(Src) 96.7 F (35.9 C) (Axillary)  Resp 20  SpO2 93% Physical Exam  Constitutional: She is oriented to person, place, and time. She appears well-developed and well-nourished. No distress.  HENT:  Head: Normocephalic and atraumatic.  Right Ear: External ear normal.  Left Ear: External ear normal.  Nose: Nose normal.  Mouth/Throat: Oropharynx is clear and moist. No oropharyngeal exudate.  Negative facial trauma Negative palpation hematomas  Negative crepitus or depression palpated to the skull/maxillary region Negative damage noted to dentition  Eyes: Conjunctivae and EOM are normal. Pupils are equal, round, and reactive to light. Right eye exhibits no discharge. Left eye exhibits no discharge.  Neck: Normal range of motion. Neck supple. No tracheal  deviation present.  Negative pain upon palpation to the c-spine  Cardiovascular: Normal rate, regular rhythm and normal heart sounds.  Exam reveals no friction rub.   No murmur heard. Pulses:      Radial pulses are 2+ on the right side, and 2+ on the left side.       Dorsalis pedis pulses are 2+ on the right side, and 2+ on the left side.  Cap refill less than 3 seconds  Pulmonary/Chest: Effort normal and breath sounds normal. No respiratory distress. She has no wheezes. She has no rales. She  exhibits no tenderness.  Abdominal: Soft. Bowel sounds are normal. She exhibits no distension. There is no tenderness. There is no rebound and no guarding.  Musculoskeletal: Normal range of motion. She exhibits no tenderness.       Back:  Ecchymosis identified to the mid sacral aspect of the back. Negative grimacing upon palpation.  Lymphadenopathy:    She has no cervical adenopathy.  Neurological: She is alert and oriented to person, place, and time. No cranial nerve deficit. She exhibits normal muscle tone. Coordination normal.  Patient is alert Patient follows commands Sensation intact with differentiation sharp and dull touch Equal grip strength bilaterally Negative facial drooping Negative slurred speech Negative aphasia  Skin: Skin is warm and dry. No rash noted. She is not diaphoretic. No erythema.  Psychiatric: She has a normal mood and affect. Her behavior is normal. Thought content normal.  Nursing note and vitals reviewed.   ED Course  Procedures (including critical care time)  Results for orders placed or performed during the hospital encounter of 02/11/15  Urinalysis, Routine w reflex microscopic  Result Value Ref Range   Color, Urine YELLOW YELLOW   APPearance CLEAR CLEAR   Specific Gravity, Urine 1.014 1.005 - 1.030   pH 7.0 5.0 - 8.0   Glucose, UA NEGATIVE NEGATIVE mg/dL   Hgb urine dipstick NEGATIVE NEGATIVE   Bilirubin Urine NEGATIVE NEGATIVE   Ketones, ur NEGATIVE NEGATIVE mg/dL   Protein, ur NEGATIVE NEGATIVE mg/dL   Urobilinogen, UA 1.0 0.0 - 1.0 mg/dL   Nitrite NEGATIVE NEGATIVE   Leukocytes, UA NEGATIVE NEGATIVE    Labs Review Labs Reviewed  CBC WITH DIFFERENTIAL/PLATELET  COMPREHENSIVE METABOLIC PANEL  URINALYSIS, ROUTINE W REFLEX MICROSCOPIC    Imaging Review No results found.   EKG Interpretation None       Dg Chest 2 View  02/11/2015   CLINICAL DATA:  Unwitnessed fall at nursing home  EXAM: CHEST  2 VIEW  COMPARISON:  None.   FINDINGS: Cardiac shadow is within normal limits. The lungs are clear bilaterally. No acute bony abnormality is seen. A chronic compression deformity of L1 is noted.  IMPRESSION: No active cardiopulmonary disease.   Electronically Signed   By: Alcide Clever M.D.   On: 02/11/2015 15:42   Dg Thoracic Spine 2 View  02/11/2015   CLINICAL DATA:  81 year old with fall at nursing home.  EXAM: THORACIC SPINE - 2 VIEW  COMPARISON:  Lumbar spine 12/12/2015  FINDINGS: Bridging osteophytes in the thoracic spine along the right side. There is a compression deformity of the L1 vertebral body, particularly along its right side. The thoracic vertebral body heights are maintained. Alignment at the cervical-thoracic junction is within normal limits. There is grade 1 anterolisthesis in the upper cervical spine at C3-C4. Visualized prevertebral soft tissues are normal. Degenerative disc and endplate changes in lower cervical spine.  IMPRESSION: Degenerative changes in the thoracic spine without acute bone  abnormality.  Age-indeterminate L1 compression fracture.  Mild anterolisthesis at C3-C4.   Electronically Signed   By: Richarda Overlie M.D.   On: 02/11/2015 15:44   Dg Lumbar Spine Complete  02/11/2015   CLINICAL DATA:  Unwitnessed fall  EXAM: LUMBAR SPINE - COMPLETE 4+ VIEW  COMPARISON:  None.  FINDINGS: Five lumbar type vertebral bodies are well visualized. Vertebral body height is well maintained with the exception of L1 which demonstrates a 30-40% anterior wedging. This appears to be chronic in nature. No definitive acute compression deformity is seen. Anterolisthesis of L4 on L5 is noted which is felt to be of a degenerative nature. Multilevel facet hypertrophic changes are seen. Diffuse aortic calcifications are noted. The overlying soft tissues are within normal limits.  IMPRESSION: Compression deformity of L1 which appears chronic in nature. No acute abnormality is seen. Multilevel degenerative changes are seen.    Electronically Signed   By: Alcide Clever M.D.   On: 02/11/2015 15:41   Dg Sacrum/coccyx  02/11/2015   CLINICAL DATA:  Fall today at nursing home, unwitnessed  EXAM: SACRUM AND COCCYX - 2+ VIEW  COMPARISON:  None.  FINDINGS: The pelvic ring is intact. Degenerative changes in the lumbar spine are seen. Fecal material is noted within the rectum. No acute fracture is seen. Anterolisthesis of L4 on L5 is again noted.  IMPRESSION: No acute abnormality seen.   Electronically Signed   By: Alcide Clever M.D.   On: 02/11/2015 15:42   Ct Head Wo Contrast  02/11/2015   CLINICAL DATA:  Found on floor in room in nursing care facility. History dementia.  EXAM: CT HEAD WITHOUT CONTRAST  CT CERVICAL SPINE WITHOUT CONTRAST  TECHNIQUE: Multidetector CT imaging of the head and cervical spine was performed following the standard protocol without intravenous contrast. Multiplanar CT image reconstructions of the cervical spine were also generated.  COMPARISON:  None.  FINDINGS: CT HEAD FINDINGS  Advanced atrophy with diffuse sulcal prominence and centralized volume loss with commensurate ex vacuo dilatation of the ventricular system. Confluent parent circular hypodensities compatible with extensive microvascular ischemic disease. Bilateral basal ganglial calcifications. Given extensive background parenchymal abnormalities, there is no CT evidence of acute large territory infarct. No intraparenchymal or extra-axial mass or hemorrhage. Normal configuration of the ventricles and basilar cisterns. No midline shift. Intracranial atherosclerosis. Limited visualization of the paranasal sinuses and mastoid air cells is normal. No air-fluid levels. Regional soft tissues appear normal. No displaced calvarial fracture.  CT CERVICAL SPINE FINDINGS  C1 to the superior endplate of T3 is imaged.  There is mild (2-3 mm) of anterolisthesis of C2 upon C3 and C3 upon C4. The dens is normally positioned between the lateral masses of C1. There is mild  degenerative change of the atlantodental articulation. Normal atlantoaxial articulations.  No fracture or static subluxation of the cervical spine. Cervical vertebral body heights are preserved. Prevertebral soft tissues are normal.  There is mild-to-moderate multilevel cervical spine DDD, likely worse at C4-C5 and C5-C6 with disc space height loss, endplate irregularity and small posteriorly directed disc osteophyte complexes at these locations.  There is osseous fusion of the left C4 and C5 facets (sagittal image 36, series 9).  Note is made of an approximately 0.8 x 0.5 cm hypo attenuating nodule within the posterior aspect of the left lobe of the thyroid (image 86, series 4 as well as a punctate dystrophic calcification with the anterior aspect of the right lobe of the thyroid (image 91). No bulky cervical lymphadenopathy  on this noncontrast examination. Atherosclerotic plaque within the bilateral carotid bulbs. Limited visualization of the lung apices demonstrates grossly symmetric pleural parenchymal thickening.  IMPRESSION: 1. Advanced atrophy and microvascular ischemic disease without acute intracranial process. 2. No fracture or static subluxation of the cervical spine. 3. Mild-to-moderate multilevel cervical spine DDD, likely worse at C4-C5 and C5-C6.   Electronically Signed   By: Simonne Come M.D.   On: 02/11/2015 15:29   Ct Cervical Spine Wo Contrast  02/11/2015   CLINICAL DATA:  Found on floor in room in nursing care facility. History dementia.  EXAM: CT HEAD WITHOUT CONTRAST  CT CERVICAL SPINE WITHOUT CONTRAST  TECHNIQUE: Multidetector CT imaging of the head and cervical spine was performed following the standard protocol without intravenous contrast. Multiplanar CT image reconstructions of the cervical spine were also generated.  COMPARISON:  None.  FINDINGS: CT HEAD FINDINGS  Advanced atrophy with diffuse sulcal prominence and centralized volume loss with commensurate ex vacuo dilatation of the  ventricular system. Confluent parent circular hypodensities compatible with extensive microvascular ischemic disease. Bilateral basal ganglial calcifications. Given extensive background parenchymal abnormalities, there is no CT evidence of acute large territory infarct. No intraparenchymal or extra-axial mass or hemorrhage. Normal configuration of the ventricles and basilar cisterns. No midline shift. Intracranial atherosclerosis. Limited visualization of the paranasal sinuses and mastoid air cells is normal. No air-fluid levels. Regional soft tissues appear normal. No displaced calvarial fracture.  CT CERVICAL SPINE FINDINGS  C1 to the superior endplate of T3 is imaged.  There is mild (2-3 mm) of anterolisthesis of C2 upon C3 and C3 upon C4. The dens is normally positioned between the lateral masses of C1. There is mild degenerative change of the atlantodental articulation. Normal atlantoaxial articulations.  No fracture or static subluxation of the cervical spine. Cervical vertebral body heights are preserved. Prevertebral soft tissues are normal.  There is mild-to-moderate multilevel cervical spine DDD, likely worse at C4-C5 and C5-C6 with disc space height loss, endplate irregularity and small posteriorly directed disc osteophyte complexes at these locations.  There is osseous fusion of the left C4 and C5 facets (sagittal image 36, series 9).  Note is made of an approximately 0.8 x 0.5 cm hypo attenuating nodule within the posterior aspect of the left lobe of the thyroid (image 86, series 4 as well as a punctate dystrophic calcification with the anterior aspect of the right lobe of the thyroid (image 91). No bulky cervical lymphadenopathy on this noncontrast examination. Atherosclerotic plaque within the bilateral carotid bulbs. Limited visualization of the lung apices demonstrates grossly symmetric pleural parenchymal thickening.  IMPRESSION: 1. Advanced atrophy and microvascular ischemic disease without acute  intracranial process. 2. No fracture or static subluxation of the cervical spine. 3. Mild-to-moderate multilevel cervical spine DDD, likely worse at C4-C5 and C5-C6.   Electronically Signed   By: Simonne Come M.D.   On: 02/11/2015 15:29   Dg Hips Bilat With Pelvis Min 5 Views  02/11/2015   CLINICAL DATA:  Unwitnessed fall at nursing home. Dementia. Bilateral hip pain. Initial encounter.  EXAM: BILATERAL HIP (WITH PELVIS) 5-6 VIEWS  COMPARISON:  None.  FINDINGS: No evidence of fracture or dislocation involving either hip. Mild bilateral hip osteoarthritis is noted.  No evidence of pelvic fracture or pelvic joint diastases. Advanced lower lumbar spine degenerative changes noted. No other bone lesions identified. Generalized osteopenia noted.  IMPRESSION: No acute findings.  Mild bilateral hip osteoarthritis. Severe lower lumbar spine degenerative changes.  Osteopenia.   Electronically Signed  By: Myles Rosenthal M.D.   On: 02/11/2015 16:02     3:39 PM Multiple attempts for calling the nursing facility by this provider and nurse without success. Nursing home called and continuously going to voicemail.  MDM   Final diagnoses:  None    Medications - No data to display  Filed Vitals:   02/11/15 1255 02/11/15 1300 02/11/15 1422  BP:  141/70 122/64  Pulse:  57 58  Temp:  96.7 F (35.9 C)   TempSrc:  Axillary   Resp:  18 20  SpO2: 97% 100% 93%   Urinalysis unremarkable-negative findings of infection. Plain film of bilateral hip with pelvis negative for acute osseous injury-bilateral hip osteoporosis noted with severe lower lumbar spine degenerative changes. Lumbar spine plain film identify compression deformity of L1 which appears to be chronic-can't acute abnormalities identified. Plain film of sacrum negative for acute osseous injury. Chest x-ray noted degenerative changes in thoracic spine without acute bony abnormalities. CT head noted advanced atrophy and microvascular ischemic disease without  acute intracranial process. No fracture or static subluxation cervical spine noted. Multiple attempts in trying to contact nursing facility without success-continues to go to voicemail. Definitive baseline of patient unknown. Presenting to the ED with fall with unknown downtime and unwitnessed. Imaging unremarkable. Labs pending. Discussed case with Everlene Farrier, PA-C. Transfer of care to Everlene Farrier, PA-C at change in shift.     Raymon Mutton, PA-C 02/11/15 1648  Elwin Mocha, MD 02/14/15 [phone]

## 2015-02-11 NOTE — ED Notes (Signed)
EMS here to pick up patient at this time.

## 2015-02-11 NOTE — ED Notes (Signed)
Bed: [facility] Expected date:  Expected time:  Means of arrival:  Comments: EMS-fall 

## 2015-02-11 NOTE — ED Notes (Signed)
Alerted CT for need for stat head/spine CT

## 2015-02-11 NOTE — ED Provider Notes (Signed)
[facility] PA-C at shift change. Please see her note for further.   Morgan Macdonald is an 81 y/o F from a nursing home with PMHx of dementia, [facility] with a unwitnessed fall that occurred today. Spoke with Morgan JamesElma Macdonald her nurse at the nursing facility reports that she fell around 12 PM today. The estimated downtime of minutes as she is in a skilled nursing facility and surrounded by many people. Her nurse reports she was at her baseline after the fall and that she is demented. She denies any recent illness or fevers. At the time of transfer of care, we are waiting on valproic acid level. Immaging is unremarkable. Valproic acid level is normal. Spoke with patient again who is very pleasant and tells me she is ready to go home. She denies any complaints and reports she is feeling well. Will discharge to care of skilled nursing facility. Advised to have follow up with her primary care in the next several days.  This patient was discussed with Dr. Criss AlvineGoldston who agrees with assessment and plan.   Results for orders placed or performed during the hospital encounter of 02/11/15  CBC with Differential/Platelet  Result Value Ref Range   WBC 5.9 4.0 - 10.5 K/uL   RBC 4.10 3.87 - 5.11 MIL/uL   Hemoglobin 13.2 12.0 - 15.0 g/dL   HCT 84.141.2 32.436.0 - 40.146.0 %   MCV 100.5 (H) 78.0 - 100.0 fL   MCH 32.2 26.0 - 34.0 pg   MCHC 32.0 30.0 - 36.0 g/dL   RDW 02.713.0 25.311.5 - 66.415.5 %   Platelets 183 150 - 400 K/uL   Neutrophils Relative % 61 43 - 77 %   Neutro Abs 3.7 1.7 - 7.7 K/uL   Lymphocytes Relative 29 12 - 46 %   Lymphs Abs 1.7 0.7 - 4.0 K/uL   Monocytes Relative 9 3 - 12 %   Monocytes Absolute 0.5 0.1 - 1.0 K/uL   Eosinophils Relative 1 0 - 5 %   Eosinophils Absolute 0.0 0.0 - 0.7 K/uL   Basophils Relative 0 0 - 1 %   Basophils Absolute 0.0 0.0 - 0.1 K/uL  Comprehensive metabolic panel  Result Value Ref Range   Sodium 143 135 - 145 mmol/L    Potassium 4.3 3.5 - 5.1 mmol/L   Chloride 105 96 - 112 mmol/L   CO2 32 19 - 32 mmol/L   Glucose, Bld 96 70 - 99 mg/dL   BUN 18 6 - 23 mg/dL   Creatinine, Ser 4.030.81 0.50 - 1.10 mg/dL   Calcium 8.8 8.4 - 47.410.5 mg/dL   Total Protein 6.4 6.0 - 8.3 g/dL   Albumin 3.3 (L) 3.5 - 5.2 g/dL   AST 24 0 - 37 U/L   ALT 13 0 - 35 U/L   Alkaline Phosphatase 56 39 - 117 U/L   Total Bilirubin 0.4 0.3 - 1.2 mg/dL   GFR calc non Af Amer 64 (L) >90 mL/min   GFR calc Af Amer 74 (L) >90 mL/min   Anion gap 6 5 - 15  Urinalysis, Routine w reflex microscopic  Result Value Ref Range   Color, Urine YELLOW YELLOW   APPearance CLEAR CLEAR   Specific Gravity, Urine 1.014 1.005 - 1.030   pH 7.0 5.0 - 8.0   Glucose, UA NEGATIVE NEGATIVE mg/dL   Hgb urine dipstick NEGATIVE NEGATIVE   Bilirubin Urine NEGATIVE NEGATIVE   Ketones, ur NEGATIVE  NEGATIVE mg/dL   Protein, ur NEGATIVE NEGATIVE mg/dL   Urobilinogen, UA 1.0 0.0 - 1.0 mg/dL   Nitrite NEGATIVE NEGATIVE   Leukocytes, UA NEGATIVE NEGATIVE  Troponin I  Result Value Ref Range   Troponin I <0.03 <0.031 ng/mL   Dg Chest 2 View  02/11/2015   CLINICAL DATA:  Unwitnessed fall at nursing home  EXAM: CHEST  2 VIEW  COMPARISON:  None.  FINDINGS: Cardiac shadow is within normal limits. The lungs are clear bilaterally. No acute bony abnormality is seen. A chronic compression deformity of L1 is noted.  IMPRESSION: No active cardiopulmonary disease.   Electronically Signed   By: Morgan Clever M.D.   On: 02/11/2015 15:42   Dg Thoracic Spine 2 View  02/11/2015   CLINICAL DATA:  81 year old with fall at nursing home.  EXAM: THORACIC SPINE - 2 VIEW  COMPARISON:  Lumbar spine 12/12/2015  FINDINGS: Bridging osteophytes in the thoracic spine along the right side. There is a compression deformity of the L1 vertebral body, particularly along its right side. The thoracic vertebral body heights are maintained. Alignment at the cervical-thoracic junction is within normal limits.  There is grade 1 anterolisthesis in the upper cervical spine at C3-C4. Visualized prevertebral soft tissues are normal. Degenerative disc and endplate changes in lower cervical spine.  IMPRESSION: Degenerative changes in the thoracic spine without acute bone abnormality.  Age-indeterminate L1 compression fracture.  Mild anterolisthesis at C3-C4.   Electronically Signed   By: Morgan Overlie M.D.   On: 02/11/2015 15:44   Dg Lumbar Spine Complete  02/11/2015   CLINICAL DATA:  Unwitnessed fall  EXAM: LUMBAR SPINE - COMPLETE 4+ VIEW  COMPARISON:  None.  FINDINGS: Five lumbar type vertebral bodies are well visualized. Vertebral body height is well maintained with the exception of L1 which demonstrates a 30-40% anterior wedging. This appears to be chronic in nature. No definitive acute compression deformity is seen. Anterolisthesis of L4 on L5 is noted which is felt to be of a degenerative nature. Multilevel facet hypertrophic changes are seen. Diffuse aortic calcifications are noted. The overlying soft tissues are within normal limits.  IMPRESSION: Compression deformity of L1 which appears chronic in nature. No acute abnormality is seen. Multilevel degenerative changes are seen.   Electronically Signed   By: Morgan Clever M.D.   On: 02/11/2015 15:41   Dg Sacrum/coccyx  02/11/2015   CLINICAL DATA:  Fall today at nursing home, unwitnessed  EXAM: SACRUM AND COCCYX - 2+ VIEW  COMPARISON:  None.  FINDINGS: The pelvic ring is intact. Degenerative changes in the lumbar spine are seen. Fecal material is noted within the rectum. No acute fracture is seen. Anterolisthesis of L4 on L5 is again noted.  IMPRESSION: No acute abnormality seen.   Electronically Signed   By: Morgan Clever M.D.   On: 02/11/2015 15:42   Ct Head Wo Contrast  02/11/2015   CLINICAL DATA:  Found on floor in room in nursing care facility. History dementia.  EXAM: CT HEAD WITHOUT CONTRAST  CT CERVICAL SPINE WITHOUT CONTRAST  TECHNIQUE: Multidetector CT imaging  of the head and cervical spine was performed following the standard protocol without intravenous contrast. Multiplanar CT image reconstructions of the cervical spine were also generated.  COMPARISON:  None.  FINDINGS: CT HEAD FINDINGS  Advanced atrophy with diffuse sulcal prominence and centralized volume loss with commensurate ex vacuo dilatation of the ventricular system. Confluent parent circular hypodensities compatible with extensive microvascular ischemic disease. Bilateral basal ganglial calcifications.  Given extensive background parenchymal abnormalities, there is no CT evidence of acute large territory infarct. No intraparenchymal or extra-axial mass or hemorrhage. Normal configuration of the ventricles and basilar cisterns. No midline shift. Intracranial atherosclerosis. Limited visualization of the paranasal sinuses and mastoid air cells is normal. No air-fluid levels. Regional soft tissues appear normal. No displaced calvarial fracture.  CT CERVICAL SPINE FINDINGS  C1 to the superior endplate of T3 is imaged.  There is mild (2-3 mm) of anterolisthesis of C2 upon C3 and C3 upon C4. The dens is normally positioned between the lateral masses of C1. There is mild degenerative change of the atlantodental articulation. Normal atlantoaxial articulations.  No fracture or static subluxation of the cervical spine. Cervical vertebral body heights are preserved. Prevertebral soft tissues are normal.  There is mild-to-moderate multilevel cervical spine DDD, likely worse at C4-C5 and C5-C6 with disc space height loss, endplate irregularity and small posteriorly directed disc osteophyte complexes at these locations.  There is osseous fusion of the left C4 and C5 facets (sagittal image 36, series 9).  Note is made of an approximately 0.8 x 0.5 cm hypo attenuating nodule within the posterior aspect of the left lobe of the thyroid (image 86, series 4 as well as a punctate dystrophic calcification with the anterior aspect  of the right lobe of the thyroid (image 91). No bulky cervical lymphadenopathy on this noncontrast examination. Atherosclerotic plaque within the bilateral carotid bulbs. Limited visualization of the lung apices demonstrates grossly symmetric pleural parenchymal thickening.  IMPRESSION: 1. Advanced atrophy and microvascular ischemic disease without acute intracranial process. 2. No fracture or static subluxation of the cervical spine. 3. Mild-to-moderate multilevel cervical spine DDD, likely worse at C4-C5 and C5-C6.   Electronically Signed   By: Simonne Come M.D.   On: 02/11/2015 15:29   Ct Cervical Spine Wo Contrast  02/11/2015   CLINICAL DATA:  Found on floor in room in nursing care facility. History dementia.  EXAM: CT HEAD WITHOUT CONTRAST  CT CERVICAL SPINE WITHOUT CONTRAST  TECHNIQUE: Multidetector CT imaging of the head and cervical spine was performed following the standard protocol without intravenous contrast. Multiplanar CT image reconstructions of the cervical spine were also generated.  COMPARISON:  None.  FINDINGS: CT HEAD FINDINGS  Advanced atrophy with diffuse sulcal prominence and centralized volume loss with commensurate ex vacuo dilatation of the ventricular system. Confluent parent circular hypodensities compatible with extensive microvascular ischemic disease. Bilateral basal ganglial calcifications. Given extensive background parenchymal abnormalities, there is no CT evidence of acute large territory infarct. No intraparenchymal or extra-axial mass or hemorrhage. Normal configuration of the ventricles and basilar cisterns. No midline shift. Intracranial atherosclerosis. Limited visualization of the paranasal sinuses and mastoid air cells is normal. No air-fluid levels. Regional soft tissues appear normal. No displaced calvarial fracture.  CT CERVICAL SPINE FINDINGS  C1 to the superior endplate of T3 is imaged.  There is mild (2-3 mm) of anterolisthesis of C2 upon C3 and C3 upon C4. The dens  is normally positioned between the lateral masses of C1. There is mild degenerative change of the atlantodental articulation. Normal atlantoaxial articulations.  No fracture or static subluxation of the cervical spine. Cervical vertebral body heights are preserved. Prevertebral soft tissues are normal.  There is mild-to-moderate multilevel cervical spine DDD, likely worse at C4-C5 and C5-C6 with disc space height loss, endplate irregularity and small posteriorly directed disc osteophyte complexes at these locations.  There is osseous fusion of the left C4 and C5 facets (sagittal  image 36, series 9).  Note is made of an approximately 0.8 x 0.5 cm hypo attenuating nodule within the posterior aspect of the left lobe of the thyroid (image 86, series 4 as well as a punctate dystrophic calcification with the anterior aspect of the right lobe of the thyroid (image 91). No bulky cervical lymphadenopathy on this noncontrast examination. Atherosclerotic plaque within the bilateral carotid bulbs. Limited visualization of the lung apices demonstrates grossly symmetric pleural parenchymal thickening.  IMPRESSION: 1. Advanced atrophy and microvascular ischemic disease without acute intracranial process. 2. No fracture or static subluxation of the cervical spine. 3. Mild-to-moderate multilevel cervical spine DDD, likely worse at C4-C5 and C5-C6.   Electronically Signed   By: Simonne Come M.D.   On: 02/11/2015 15:29   Dg Hips Bilat With Pelvis Min 5 Views  02/11/2015   CLINICAL DATA:  Unwitnessed fall at nursing home. Dementia. Bilateral hip pain. Initial encounter.  EXAM: BILATERAL HIP (WITH PELVIS) 5-6 VIEWS  COMPARISON:  None.  FINDINGS: No evidence of fracture or dislocation involving either hip. Mild bilateral hip osteoarthritis is noted.  No evidence of pelvic fracture or pelvic joint diastases. Advanced lower lumbar spine degenerative changes noted. No other bone lesions identified. Generalized osteopenia noted.   IMPRESSION: No acute findings.  Mild bilateral hip osteoarthritis. Severe lower lumbar spine degenerative changes.  Osteopenia.   Electronically Signed   By: Myles Rosenthal M.D.   On: 02/11/2015 16:02      Lawana Chambers, PA-C 02/11/15 2022  Audree Camel, MD 02/14/15 [phone]

## 2015-02-11 NOTE — Discharge Instructions (Signed)
Fall Prevention and Home Safety °Falls cause injuries and can affect all age groups. It is possible to use preventive measures to significantly decrease the likelihood of falls. There are many simple measures which can make your home safer and prevent falls. °OUTDOORS °· Repair cracks and edges of walkways and driveways. °· Remove high doorway thresholds. °· Trim shrubbery on the main path into your home. °· Have good outside lighting. °· Clear walkways of tools, rocks, debris, and clutter. °· Check that handrails are not broken and are securely fastened. Both sides of steps should have handrails. °· Have leaves, snow, and ice cleared regularly. °· Use sand or salt on walkways during winter months. °· In the garage, clean up grease or oil spills. °BATHROOM °· Install night lights. °· Install grab bars by the toilet and in the tub and shower. °· Use non-skid mats or decals in the tub or shower. °· Place a plastic non-slip stool in the shower to sit on, if needed. °· Keep floors dry and clean up all water on the floor immediately. °· Remove soap buildup in the tub or shower on a regular basis. °· Secure bath mats with non-slip, double-sided rug tape. °· Remove throw rugs and tripping hazards from the floors. °BEDROOMS °· Install night lights. °· Make sure a bedside light is easy to reach. °· Do not use oversized bedding. °· Keep a telephone by your bedside. °· Have a firm chair with side arms to use for getting dressed. °· Remove throw rugs and tripping hazards from the floor. °KITCHEN °· Keep handles on pots and pans turned toward the center of the stove. Use back burners when possible. °· Clean up spills quickly and allow time for drying. °· Avoid walking on wet floors. °· Avoid hot utensils and knives. °· Position shelves so they are not too high or low. °· Place commonly used objects within easy reach. °· If necessary, use a sturdy step stool with a grab bar when reaching. °· Keep electrical cables out of the  way. °· Do not use floor polish or wax that makes floors slippery. If you must use wax, use non-skid floor wax. °· Remove throw rugs and tripping hazards from the floor. °STAIRWAYS °· Never leave objects on stairs. °· Place handrails on both sides of stairways and use them. Fix any loose handrails. Make sure handrails on both sides of the stairways are as long as the stairs. °· Check carpeting to make sure it is firmly attached along stairs. Make repairs to worn or loose carpet promptly. °· Avoid placing throw rugs at the top or bottom of stairways, or properly secure the rug with carpet tape to prevent slippage. Get rid of throw rugs, if possible. °· Have an electrician put in a light switch at the top and bottom of the stairs. °OTHER FALL PREVENTION TIPS °· Wear low-heel or rubber-soled shoes that are supportive and fit well. Wear closed toe shoes. °· When using a stepladder, make sure it is fully opened and both spreaders are firmly locked. Do not climb a closed stepladder. °· Add color or contrast paint or tape to grab bars and handrails in your home. Place contrasting color strips on first and last steps. °· Learn and use mobility aids as needed. Install an electrical emergency response system. °· Turn on lights to avoid dark areas. Replace light bulbs that burn out immediately. Get light switches that glow. °· Arrange furniture to create clear pathways. Keep furniture in the same place. °·   Firmly attach carpet with non-skid or double-sided tape. °· Eliminate uneven floor surfaces. °· Select a carpet pattern that does not visually hide the edge of steps. °· Be aware of all pets. °OTHER HOME SAFETY TIPS °· Set the water temperature for 120° F (48.8° C). °· Keep emergency numbers on or near the telephone. °· Keep smoke detectors on every level of the home and near sleeping areas. °Document Released: 12/07/2002 Document Revised: 06/17/2012 Document Reviewed: 03/07/2012 °[employer]® Patient Information ©2015  [employer]. This information is not intended to replace advice given to you by your health care provider. Make sure you discuss any questions you have with your health care provider. ° ° °Head Injury °You have received a head injury. It does not appear serious at this time. Headaches and vomiting are common following head injury. It should be easy to awaken from sleeping. Sometimes it is necessary for you to stay in the emergency department for a while for observation. Sometimes admission to the hospital may be needed. After injuries such as yours, most problems occur within the first 24 hours, but side effects may occur up to 7-10 days after the injury. It is important for you to carefully monitor your condition and contact your health care provider or seek immediate medical care if there is a change in your condition. °WHAT ARE THE TYPES OF HEAD INJURIES? °Head injuries can be as minor as a bump. Some head injuries can be more severe. More severe head injuries include: °· A jarring injury to the brain (concussion). °· A bruise of the brain (contusion). This mean there is bleeding in the brain that can cause swelling. °· A cracked skull (skull fracture). °· Bleeding in the brain that collects, clots, and forms a bump (hematoma). °WHAT CAUSES A HEAD INJURY? °A serious head injury is most likely to happen to someone who is in a car wreck and is not wearing a seat belt. Other causes of major head injuries include bicycle or motorcycle accidents, sports injuries, and falls. °HOW ARE HEAD INJURIES DIAGNOSED? °A complete history of the event leading to the injury and your current symptoms will be helpful in diagnosing head injuries. Many times, pictures of the brain, such as CT or MRI are needed to see the extent of the injury. Often, an overnight hospital stay is necessary for observation.  °WHEN SHOULD I SEEK IMMEDIATE MEDICAL CARE?  °You should get help right away if: °· You have confusion or drowsiness. °· You  feel sick to your stomach (nauseous) or have continued, forceful vomiting. °· You have dizziness or unsteadiness that is getting worse. °· You have severe, continued headaches not relieved by medicine. Only take over-the-counter or prescription medicines for pain, fever, or discomfort as directed by your health care provider. °· You do not have normal function of the arms or legs or are unable to walk. °· You notice changes in the black spots in the center of the colored part of your eye (pupil). °· You have a clear or bloody fluid coming from your nose or ears. °· You have a loss of vision. °During the next 24 hours after the injury, you must stay with someone who can watch you for the warning signs. This person should contact local emergency services (911 in the [place] if you have seizures, you become unconscious, or you are unable to wake up. °HOW CAN I PREVENT A HEAD INJURY IN THE FUTURE? °The most important factor for preventing major head injuries is avoiding motor   vehicle accidents.  To minimize the potential for damage to your head, it is crucial to wear seat belts while riding in motor vehicles. Wearing helmets while bike riding and playing collision sports (like football) is also helpful. Also, avoiding dangerous activities around the house will further help reduce your risk of head injury.  °WHEN CAN I RETURN TO NORMAL ACTIVITIES AND ATHLETICS? °You should be reevaluated by your health care provider before returning to these activities. If you have any of the following symptoms, you should not return to activities or contact sports until 1 week after the symptoms have stopped: °· Persistent headache. °· Dizziness or vertigo. °· Poor attention and concentration. °· Confusion. °· Memory problems. °· Nausea or vomiting. °· Fatigue or tire easily. °· Irritability. °· Intolerant of bright lights or loud noises. °· Anxiety or depression. °· Disturbed sleep. °MAKE SURE YOU:  °· Understand these  instructions. °· Will watch your condition. °· Will get help right away if you are not doing well or get worse. °Document Released: 12/17/2005 Document Revised: 12/22/2013 Document Reviewed: 08/24/2013 °ExitCare® Patient Information ©2015 [employer]. This information is not intended to replace advice given to you by your health care provider. Make sure you discuss any questions you have with your health care provider. ° °

## 2015-02-11 NOTE — ED Notes (Signed)
Attempted to call the nursing home, received voicemail, will try again later to contact responsible personnel for information on patient's baseline neurologic status

## 2015-02-11 NOTE — ED Notes (Signed)
Alerted PA to possible need for CT, pt sleeping at this time, arousable

## 2015-02-11 NOTE — ED Notes (Signed)
Per EMS, pt was found on the floor in her room at her nursing care facility. Unwitnessed fall, no complaints of pain from the patient. Hx of dementia, per EMS patient passed the spinal cord clearance assessment on scene and brought in off spine board, no C-collar.

## 2015-02-25 ENCOUNTER — Encounter (HOSPITAL_COMMUNITY): Payer: Self-pay | Admitting: Emergency Medicine

## 2015-12-10 IMAGING — CR DG HIP (WITH OR WITHOUT PELVIS) 5+V BILAT
6 series · 6 of 6 positions shown · non-contrast
Comparison: None.

CLINICAL DATA: Unwitnessed fall at [HOSPITAL]. Dementia.
Bilateral hip pain. Initial encounter.

EXAM:
BILATERAL HIP (WITH PELVIS) 5-6 VIEWS

[t pelvis ap]
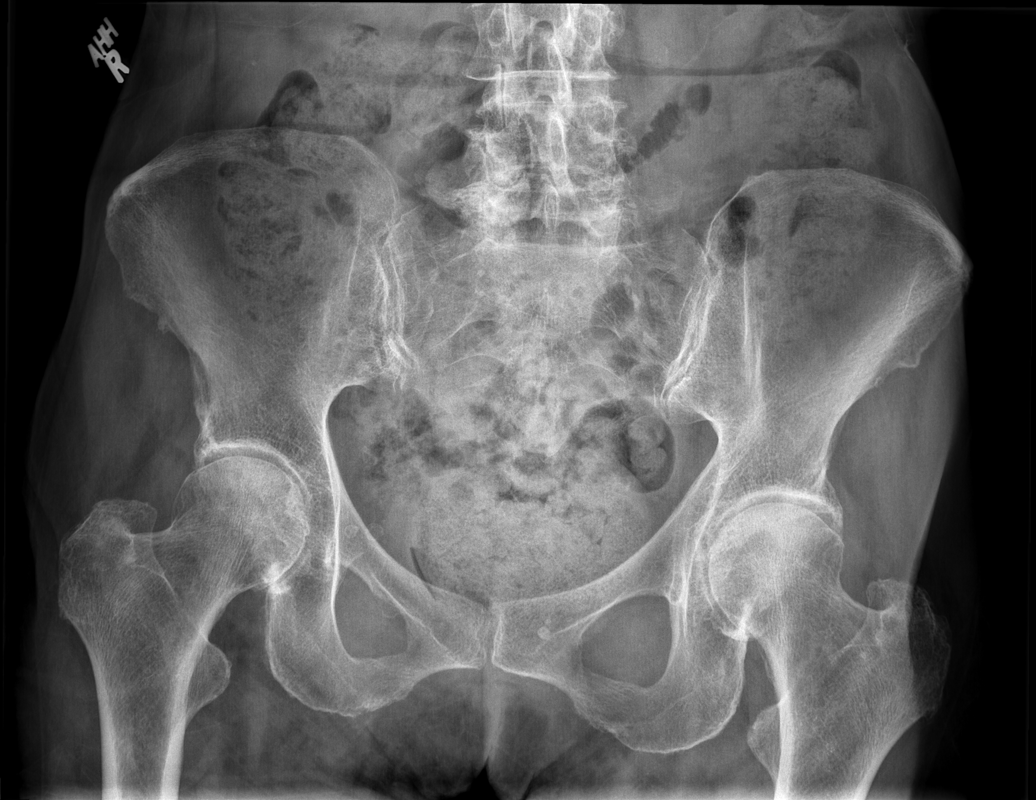

[t hip ap left]
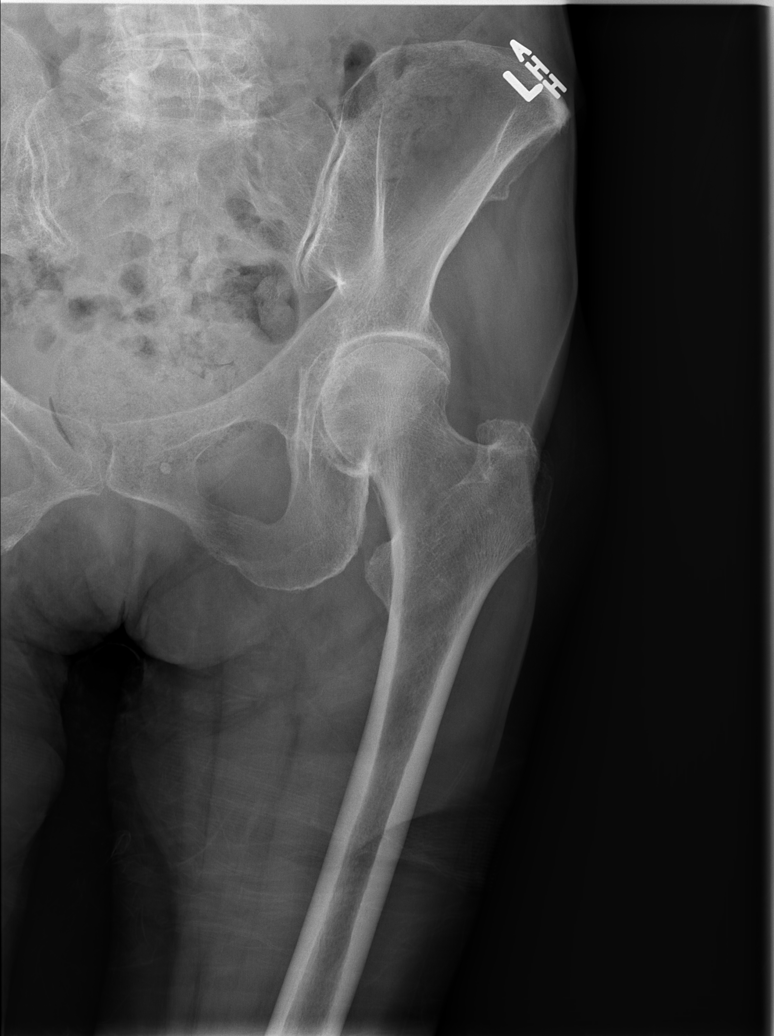

[t hip ap right]
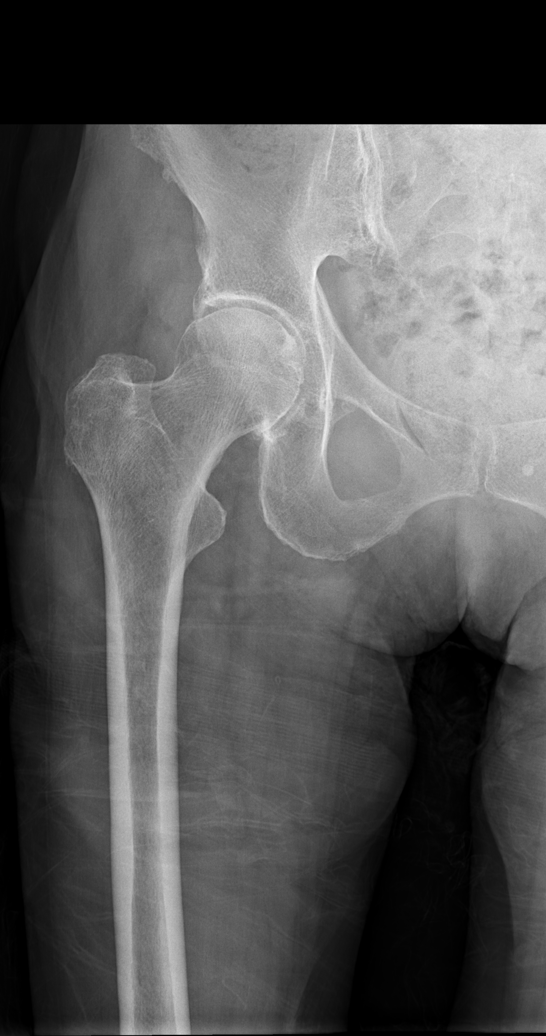

[t hip frog leg right]
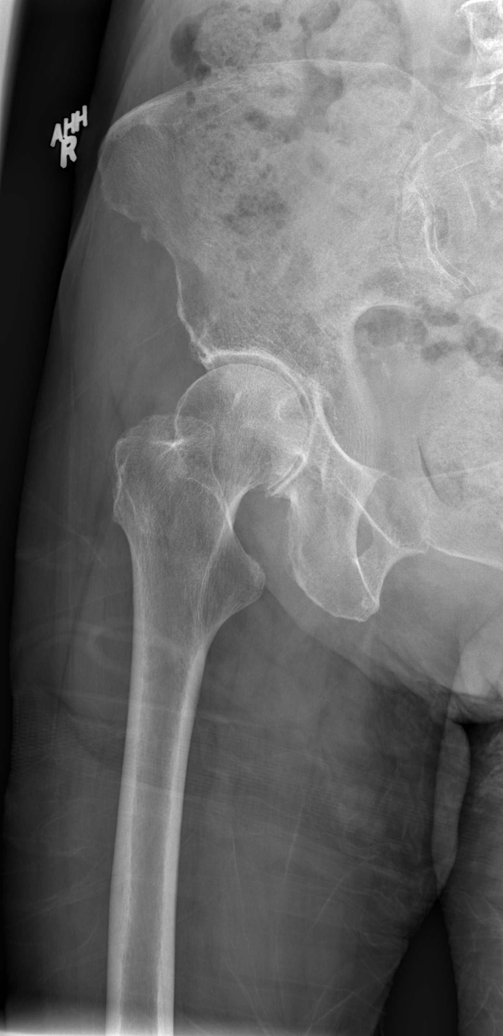

[t hip frog leg left (1 of 2)]
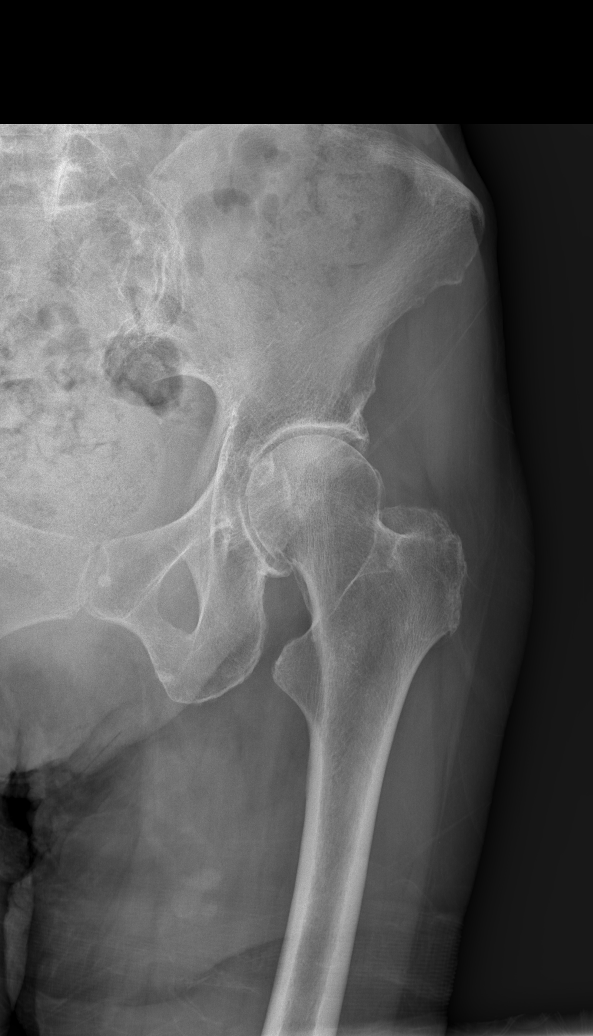

[t hip frog leg left (2 of 2)]
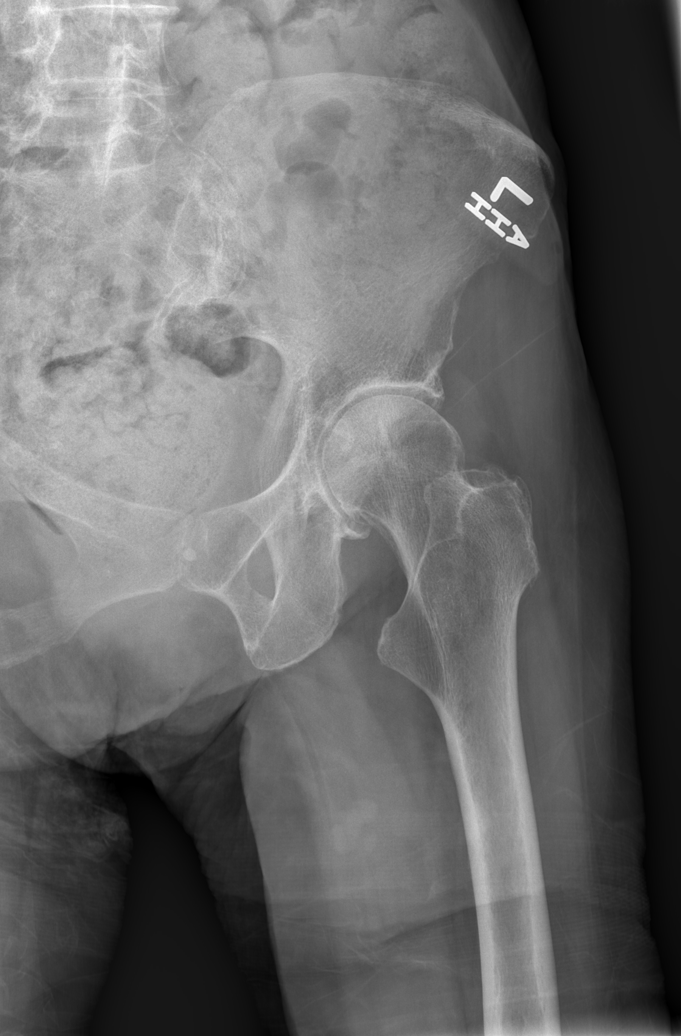

[6 of 6 positions shown; findings below may reference images not displayed]

FINDINGS: No evidence of fracture or dislocation involving either hip. Mild
bilateral hip osteoarthritis is noted.

No evidence of pelvic fracture or pelvic joint diastases. Advanced
lower lumbar spine degenerative changes noted. No other bone lesions
identified. Generalized osteopenia noted.
IMPRESSION: No acute findings.

Mild bilateral hip osteoarthritis. Severe lower lumbar spine
degenerative changes.

Osteopenia.

## 2015-12-10 IMAGING — CR DG SACRUM/COCCYX 2+V
3 series · 3 of 3 positions shown · non-contrast
Comparison: None.

CLINICAL DATA: Fall today at [HOSPITAL], unwitnessed

EXAM:
SACRUM AND COCCYX - 2+ VIEW

[t sacrum ap]
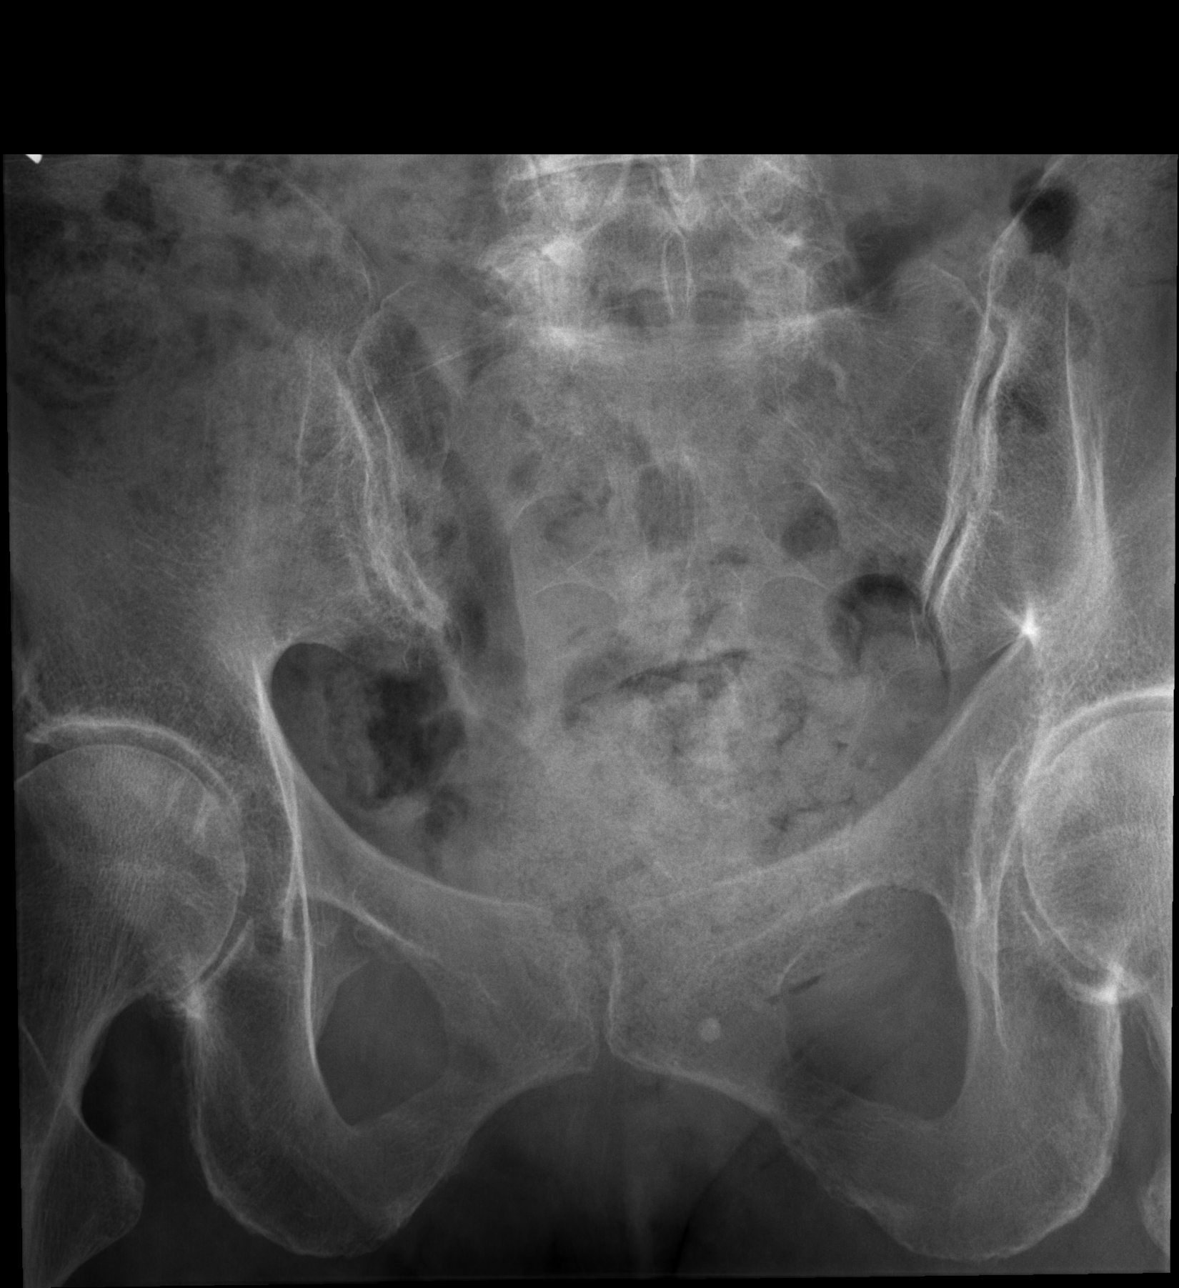

[t coccyx ap]
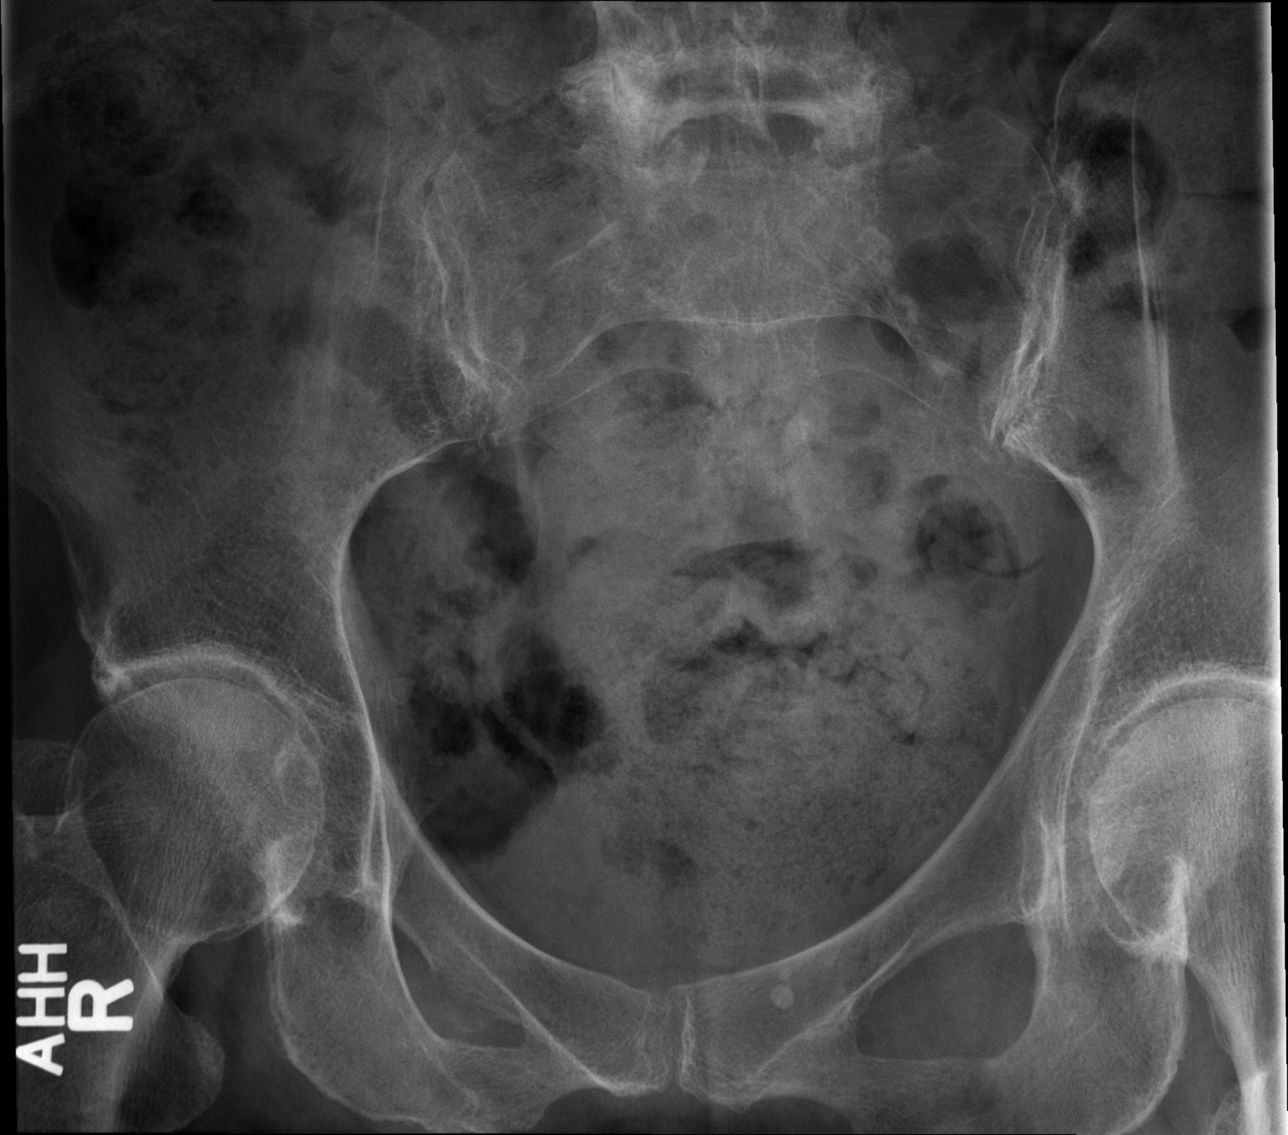

[t sacrum coccyx lat]
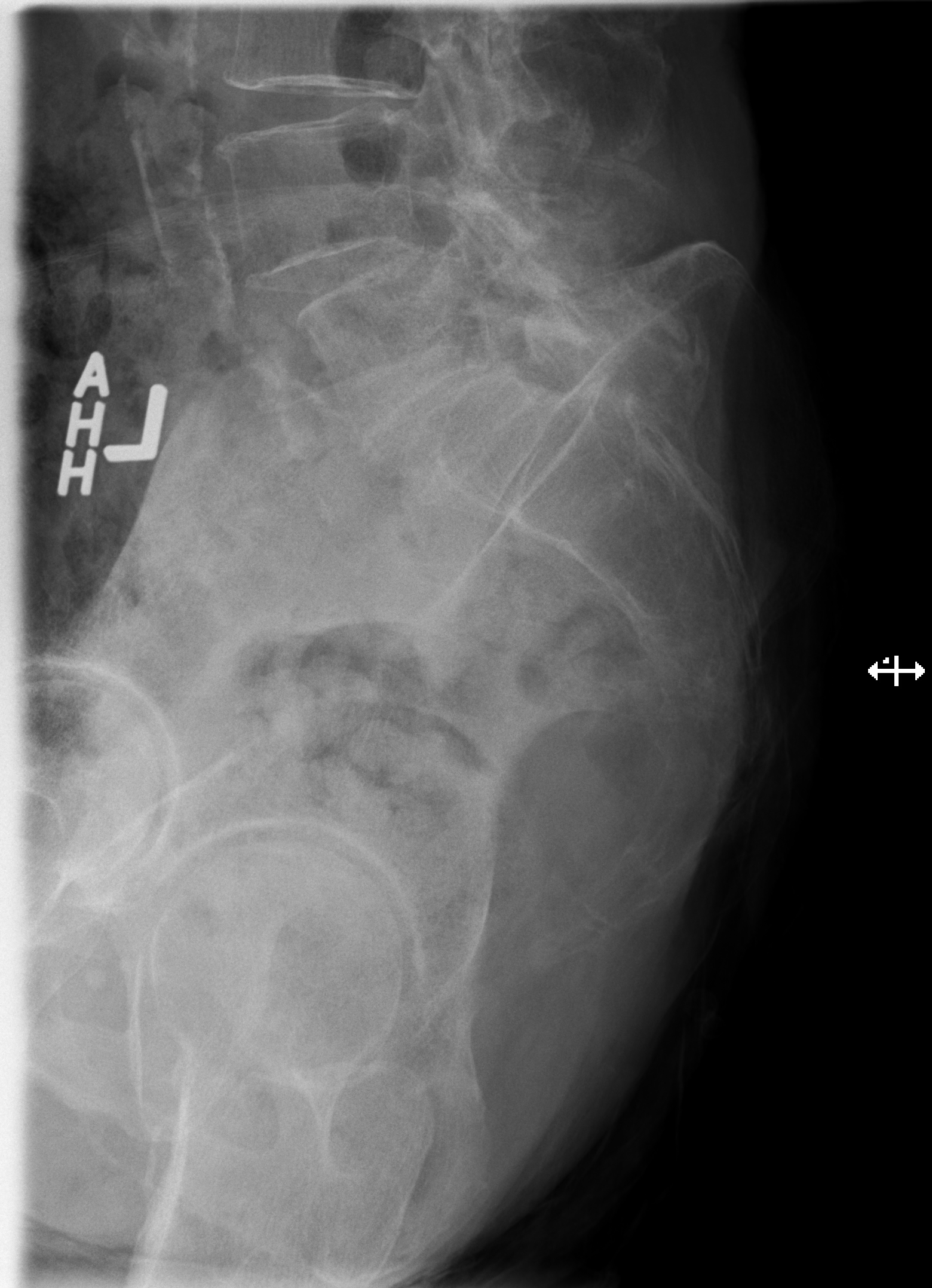

[3 of 3 positions shown; findings below may reference images not displayed]

FINDINGS: The pelvic ring is intact. Degenerative changes in the lumbar spine
are seen. Fecal material is noted within the rectum. No acute
fracture is seen. Anterolisthesis of L4 on L5 is again noted..
IMPRESSION: No acute abnormality seen.

## 2016-02-01 DEATH — deceased
